# Patient Record
Sex: Female | Born: 1935 | Race: White | Hispanic: No | Marital: Married | Smoking: Never smoker
Health system: Southern US, Community
[De-identification: ages and names within clinical notes are randomized; demographics above are authoritative.]

## PROBLEM LIST (undated history)

## (undated) DIAGNOSIS — K759 Inflammatory liver disease, unspecified: Secondary | ICD-10-CM

## (undated) DIAGNOSIS — I4891 Unspecified atrial fibrillation: Secondary | ICD-10-CM

## (undated) DIAGNOSIS — I5022 Chronic systolic (congestive) heart failure: Secondary | ICD-10-CM

## (undated) DIAGNOSIS — I447 Left bundle-branch block, unspecified: Secondary | ICD-10-CM

## (undated) DIAGNOSIS — E039 Hypothyroidism, unspecified: Secondary | ICD-10-CM

## (undated) DIAGNOSIS — Z86711 Personal history of pulmonary embolism: Secondary | ICD-10-CM

## (undated) HISTORY — PX: EYE SURGERY: SHX253

## (undated) HISTORY — DX: Inflammatory liver disease, unspecified: K75.9

## (undated) HISTORY — DX: Left bundle-branch block, unspecified: I44.7

## (undated) HISTORY — DX: Unspecified atrial fibrillation: I48.91

## (undated) HISTORY — DX: Hypothyroidism, unspecified: E03.9

## (undated) HISTORY — PX: JOINT REPLACEMENT: SHX530

## (undated) HISTORY — DX: Chronic systolic (congestive) heart failure: I50.22

## (undated) HISTORY — DX: Personal history of pulmonary embolism: Z86.711

## (undated) HISTORY — PX: CHOLECYSTECTOMY: SHX55

---

## 2002-05-16 ENCOUNTER — Ambulatory Visit (HOSPITAL_COMMUNITY): Admission: RE | Admit: 2002-05-16 | Discharge: 2002-05-16 | Payer: Self-pay | Admitting: Ophthalmology

## 2008-11-20 ENCOUNTER — Ambulatory Visit (HOSPITAL_COMMUNITY): Admission: RE | Admit: 2008-11-20 | Discharge: 2008-11-20 | Payer: Self-pay | Admitting: Ophthalmology

## 2015-07-19 ENCOUNTER — Encounter: Payer: Self-pay | Admitting: *Deleted

## 2015-09-14 ENCOUNTER — Ambulatory Visit: Payer: Self-pay | Admitting: Cardiology

## 2015-09-14 ENCOUNTER — Encounter: Payer: Self-pay | Admitting: Cardiology

## 2015-09-14 ENCOUNTER — Encounter: Payer: Self-pay | Admitting: *Deleted

## 2015-09-14 ENCOUNTER — Ambulatory Visit (INDEPENDENT_AMBULATORY_CARE_PROVIDER_SITE_OTHER): Payer: Medicare HMO | Admitting: Cardiology

## 2015-09-14 VITALS — BP 116/71 | HR 69 | Ht 66.0 in | Wt 182.6 lb

## 2015-09-14 DIAGNOSIS — I5022 Chronic systolic (congestive) heart failure: Secondary | ICD-10-CM

## 2015-09-14 DIAGNOSIS — E890 Postprocedural hypothyroidism: Secondary | ICD-10-CM

## 2015-09-14 DIAGNOSIS — I482 Chronic atrial fibrillation, unspecified: Secondary | ICD-10-CM

## 2015-09-14 DIAGNOSIS — Z136 Encounter for screening for cardiovascular disorders: Secondary | ICD-10-CM | POA: Diagnosis not present

## 2015-09-14 DIAGNOSIS — Z86711 Personal history of pulmonary embolism: Secondary | ICD-10-CM

## 2015-09-14 NOTE — Progress Notes (Signed)
Cardiology Office Note  Date: 09/14/2015   ID: Aimee Miles, DOB 10-13-1930, MRN [id number]  PCP: Aimee Conrad, MD  Consulting Cardiologist: Aimee Dell, MD   Chief Complaint  Patient presents with  . Atrial Fibrillation  . Cardiomyopathy    History of Present Illness: Aimee Miles is an 82 y.o. female referred to establish cardiology follow-up by Dr. Loney Hering. She previously followed by Dr. Molly Miles in the [facility] cardiology practice, last seen earlier this year. I do not have complete information but did update the chart with her history as outlined below.  She is here today with her daughter. She reports NYHA class 2-3 dyspnea, worse in the high humidity and heat. She does not describe any sense of palpitations or exertional chest pain. I reviewed her ECG today which shows atrial fibrillation at 74 bpm with left bundle branch block.  No old ECG available for comparison. I could not locate her last echocardiogram report or find a documented LVEF in the available records. Patient was under the impression that she was going to have a follow-up echocardiogram with Dr. Molly Miles in the near future.  I reviewed her medications which are outlined below. Cardiac regimen includes Eliquis, Coreg, Lasix, Imdur, Cozaar, and potassium supplements. She will be having a physical with lab work per Dr. Loney Hering next month. She does not report any bleeding episodes.  Past Medical History  Diagnosis Date  . Hepatitis   . Atrial fibrillation (HCC)   . LBBB (left bundle branch block)   . Chronic systolic (congestive) heart failure (HCC)   . Hypothyroidism   . History of pulmonary embolus (PE)     Bilateral PE January 2015 on Coumadin until June 2015    Past Surgical History  Procedure Laterality Date  . Cholecystectomy    . Eye surgery    . Joint replacement      Current Outpatient Prescriptions  Medication Sig Dispense Refill  . acetaminophen (TYLENOL) 325 MG tablet Take 325 mg by  mouth as needed.    Marland Kitchen apixaban (ELIQUIS) 2.5 MG TABS tablet Take 2.5 mg by mouth 2 (two) times daily.    . carvedilol (COREG) 12.5 MG tablet Take 12.5 mg by mouth 2 (two) times daily with a meal.    . clobetasol ointment (TEMOVATE) 0.05 % Apply 1 application topically 2 (two) times daily as needed.    . ferrous gluconate (FERGON) 324 MG tablet Take 324 mg by mouth daily with breakfast.    . folic acid (FOLVITE) 1 MG tablet Take 1 mg by mouth daily. Reported on 09/14/2015    . furosemide (LASIX) 40 MG tablet Take 40 mg by mouth daily.    Marland Kitchen HYDROcodone-acetaminophen (NORCO/VICODIN) 5-325 MG tablet Take 1 tablet by mouth every 6 (six) hours as needed for moderate pain.    . isosorbide mononitrate (IMDUR) 30 MG 24 hr tablet Take 30 mg by mouth daily.    Marland Kitchen levothyroxine (SYNTHROID, LEVOTHROID) 112 MCG tablet Take 112 mcg by mouth daily before breakfast.    . losartan (COZAAR) 25 MG tablet Take 25 mg by mouth daily.    . Multiple Vitamins-Minerals (MULTIVITAMIN ADULTS PO) Take 1 tablet by mouth 2 (two) times daily. Solutions Rx Superior Women's Multivitamin    . omeprazole (PRILOSEC) 20 MG capsule Take 20 mg by mouth daily.    . polyethylene glycol (MIRALAX / GLYCOLAX) packet Take 17 g by mouth daily as needed.    . potassium chloride (K-DUR) 10 MEQ tablet Take  10 mEq by mouth daily.    Marland Kitchen venlafaxine (EFFEXOR) 37.5 MG tablet Take 37.5 mg by mouth 2 (two) times daily.     No current facility-administered medications for this visit.   Allergies:  Codeine; Penicillins; and Sulfa antibiotics   Social History: The patient  reports that she has never smoked. She has never used smokeless tobacco. She reports that she does not drink alcohol or use illicit drugs.   Family History: The patient's family history includes Cervical cancer in her sister; Diabetes in her brother and father; Heart failure in her mother; Ovarian cancer in her daughter; Pancreatic cancer in her brother.   ROS:  Please see the history  of present illness. Otherwise, complete review of systems is positive for arthritic leg deformities, uses a cane.  All other systems are reviewed and negative.   Physical Exam: VS:  BP 116/71 mmHg  Pulse 69  Ht  (1.676 m)  Wt 182 lb 9.6 oz (82.827 kg)  BMI 29.49 kg/m2  SpO2 97%, BMI Body mass index is 29.49 kg/(m^2).  Wt Readings from Last 3 Encounters:  09/14/15 182 lb 9.6 oz (82.827 kg)    General: Elderly woman, appears comfortable at rest. HEENT: Conjunctiva and lids normal, oropharynx clear. Neck: Supple, no elevated JVP or carotid bruits, no thyromegaly. Lungs: Clear to auscultation, nonlabored breathing at rest. Cardiac: Irregularly irregular, no S3, soft systolic murmur, no pericardial rub. Abdomen: Soft, nontender, bowel sounds present, no guarding or rebound. Extremities: Venous stasis, distal pulses 2+. Skin: Warm and dry. Musculoskeletal: No kyphosis. Arthritic ankle deformities. Neuropsychiatric: Alert and oriented x3, affect grossly appropriate.  ECG: No old tracing available for comparison.  Assessment and Plan:  1. Chronic atrial fibrillation by history. CHADSVASC score is 4. She is on Eliquis for stroke prophylaxis, does not report any bleeding problems. She is due for follow-up lab work with Dr. Loney Hering. I reviewed her ECG today. Heart rate control looks to be adequate on present regimen.  2. History of chronic systolic heart failure based on records, LVEF not certain at this time. She reports NYHA class 2-3 dyspnea. Plan is to obtain her old echocardiogram report from [place], and follow-up with a repeat study to get a more recent assessment.  3. Hypothyroidism, on Synthroid, followed by Dr. Loney Hering.  4. Previous history of bilateral pulmonary emboli in 2015.  Current medicines were reviewed with the patient today.   Orders Placed This Encounter  Procedures  . EKG 12-Lead  . ECHOCARDIOGRAM COMPLETE    Disposition: Follow-up with me in 6  weeks.  Signed, Jonelle Sidle, MD, [facility] 09/14/2015 9:40 AM    [facility] at [facility] [address] [place], [place], [place] [address] Phone: [phone]; Fax: [phone]

## 2015-09-14 NOTE — Patient Instructions (Addendum)
Medication Instructions:   Your physician recommends that you continue on your current medications as directed. Please refer to the Current Medication list given to you today.   Labwork:  NONE  Testing/Procedures:  Your physician has requested that you have an echocardiogram. Echocardiography is a painless test that uses sound waves to create images of your heart. It provides your doctor with information about the size and shape of your heart and how well your heart's chambers and valves are working. This procedure takes approximately one hour. There are no restrictions for this procedure.    Follow-Up:  Your physician recommends that you schedule a follow-up appointment in: 6 weeks   Any Other Special Instructions Will Be Listed Below (If Applicable).     If you need a refill on your cardiac medications before your next appointment, please call your pharmacy.   

## 2015-09-27 ENCOUNTER — Other Ambulatory Visit: Payer: Self-pay

## 2015-09-27 ENCOUNTER — Ambulatory Visit (INDEPENDENT_AMBULATORY_CARE_PROVIDER_SITE_OTHER): Payer: Medicare HMO

## 2015-09-27 DIAGNOSIS — Z136 Encounter for screening for cardiovascular disorders: Secondary | ICD-10-CM

## 2015-09-27 LAB — ECHOCARDIOGRAM COMPLETE
E decel time: 251 ms
FS: 11 % — AB (ref 28–44)
IVS/LV PW RATIO, ED: 0.72
LA ID, A-P, ES: 39 mm
LA diam end sys: 39 mm
LA diam index: 2.03 cm/m2
LA vol A4C: 65.4 mL
LA vol index: 38.8 mL/m2
LA vol: 74.4 mL
LV PW d: 10.8 mm — AB (ref 0.6–1.1)
LV dias vol index: 32 mL/m2
LV dias vol: 62 mL (ref 46–106)
LV sys vol index: 25 mL/m2
LV sys vol: 48 mL — AB (ref 14–42)
LVOT SV: 40 mL
LVOT VTI: 14.1 cm
LVOT area: 2.84 cm2
LVOT diameter: 19 mm
LVOT peak vel: 87.2 cm/s
MV Dec: 251
MV Peak grad: 5 mmHg
MV pk E vel: 112 m/s
Simpson's disk: 23
Stroke v: 14 mL
TAPSE: 19.6 mm

## 2015-09-28 ENCOUNTER — Telehealth: Payer: Self-pay | Admitting: *Deleted

## 2015-09-28 NOTE — Telephone Encounter (Signed)
-----   Message from Jonelle Sidle, MD sent at 09/27/2015  3:19 PM EDT ----- Results reviewed. Patient recently established, former patient of Dr. Molly Maduro. She has a history of cardiomyopathy and her LVEF is stable compared to the prior study done at [facility]. We will continue medical therapy and discuss further follow-up visit. A copy of this test should be forwarded to Ernestine Conrad, MD.

## 2015-09-28 NOTE — Telephone Encounter (Signed)
Patient informed and copy sent to PCP. 

## 2015-10-31 NOTE — Progress Notes (Signed)
Cardiology Office Note  Date: 11/01/2015   ID: DEBORAN MCKINNEY, DOB 11/24/1930, MRN [id number]  PCP: Ernestine Conrad, MD  Primary Cardiologist: Nona Dell, MD   Chief Complaint  Patient presents with  . Atrial Fibrillation  . Cardiomyopathy    History of Present Illness: Aimee Miles is an 82 y.o. female that recently established follow-up in July. She presents for a follow-up visit. Overall no major change in status. She does not report palpitations, has chronic dyspnea on exertion as before.  Follow-up echocardiogram done recently revealed LVEF 35-40% which is in line with her previous echocardiogram from [facility] in 2015. We discussed the results today. She reports compliance with her medications which are outlined below. Blood pressure and heart rate are well controlled today. Also states that her weight does not fluctuate more than about 2 pounds, and has been stable. She knows to take an extra Lasix if her weight goes up 2-3 pounds in 24 hours.  Past Medical History:  Diagnosis Date  . Atrial fibrillation (HCC)   . Chronic systolic (congestive) heart failure (HCC)   . Hepatitis   . History of pulmonary embolus (PE)    Bilateral PE January 2015 on Coumadin until June 2015  . Hypothyroidism   . LBBB (left bundle branch block)     Current Outpatient Prescriptions  Medication Sig Dispense Refill  . acetaminophen (TYLENOL) 325 MG tablet Take 325 mg by mouth as needed.    Marland Kitchen apixaban (ELIQUIS) 2.5 MG TABS tablet Take 2.5 mg by mouth 2 (two) times daily.    . carvedilol (COREG) 12.5 MG tablet Take 12.5 mg by mouth 2 (two) times daily with a meal.    . clobetasol ointment (TEMOVATE) 0.05 % Apply 1 application topically 2 (two) times daily as needed.    . ferrous gluconate (FERGON) 324 MG tablet Take 324 mg by mouth daily with breakfast.    . folic acid (FOLVITE) 1 MG tablet Take 1 mg by mouth daily. Reported on 09/14/2015    . furosemide (LASIX) 40 MG tablet Take 40 mg by  mouth daily.    Marland Kitchen HYDROcodone-acetaminophen (NORCO/VICODIN) 5-325 MG tablet Take 1 tablet by mouth every 6 (six) hours as needed for moderate pain.    . isosorbide mononitrate (IMDUR) 30 MG 24 hr tablet Take 30 mg by mouth daily.    Marland Kitchen levothyroxine (SYNTHROID, LEVOTHROID) 112 MCG tablet Take 112 mcg by mouth daily before breakfast.    . losartan (COZAAR) 25 MG tablet Take 25 mg by mouth daily.    . Multiple Vitamins-Minerals (MULTIVITAMIN ADULTS PO) Take 1 tablet by mouth 2 (two) times daily. Solutions Rx Superior Women's Multivitamin    . omeprazole (PRILOSEC) 20 MG capsule Take 20 mg by mouth daily.    . polyethylene glycol (MIRALAX / GLYCOLAX) packet Take 17 g by mouth daily as needed.    . potassium chloride (K-DUR) 10 MEQ tablet Take 10 mEq by mouth daily.    Marland Kitchen venlafaxine (EFFEXOR) 37.5 MG tablet Take 37.5 mg by mouth 2 (two) times daily.     No current facility-administered medications for this visit.    Allergies:  Codeine; Penicillins; and Sulfa antibiotics   Social History: The patient  reports that she has never smoked. She has never used smokeless tobacco. She reports that she does not drink alcohol or use drugs.   ROS:  Please see the history of present illness. Otherwise, complete review of systems is positive for chronic leg pain and arthritic  symptoms.  All other systems are reviewed and negative.   Physical Exam: VS:  BP 120/78   Pulse 62   Ht 5\' 6"  (1.676 m)   Wt 180 lb (81.6 kg)   SpO2 98%   BMI 29.05 kg/m , BMI Body mass index is 29.05 kg/m.  Wt Readings from Last 3 Encounters:  11/01/15 180 lb (81.6 kg)  09/14/15 182 lb 9.6 oz (82.8 kg)    General: Overweight elderly woman, appears comfortable at rest. HEENT: Conjunctiva and lids normal, oropharynx clear. Neck: Supple, no elevated JVP or carotid bruits, no thyromegaly. Lungs: Clear to auscultation, nonlabored breathing at rest. Cardiac: Irregularly irregular, no S3, soft systolic murmur, no pericardial  rub. Abdomen: Soft, nontender, bowel sounds present, no guarding or rebound. Extremities: Venous stasis and chronic appearing leg edema, distal pulses 2+.  ECG: I personally reviewed the tracing from 09/14/2015 which showed rate-controlled atrial fibrillation with left bundle branch block.  Other Studies Reviewed Today:  Echocardiogram 09/27/2015: Study Conclusions  - Left ventricle: The cavity size was normal. Wall thickness was   normal. Systolic function was moderately reduced. The estimated   ejection fraction was in the range of 35% to 40%. Diffuse   hypokinesis. The study is not technically sufficient to allow   evaluation of LV diastolic function. - Ventricular septum: Septal motion showed paradox. - Aortic valve: Calcified annulus. Trileaflet. - Mitral valve: There was trivial regurgitation. - Left atrium: The atrium was moderately dilated. - Right atrium: Central venous pressure (est): 3 mm Hg. - Atrial septum: The septum bowed from left to right, consistent   with increased left atrial pressure. No defect or patent foramen   ovale was identified. - Tricuspid valve: There was moderate regurgitation. - Pulmonary arteries: PA peak pressure: 35 mm Hg (S). - Pericardium, extracardiac: There was no pericardial effusion.  Impressions:  - Normal LV wall thickness with LVEF 35-40%. There is diffuse   hypokinesis with paradoxical septal motion consistent with left   bundle branch block. Indeterminate diastolic function in the   setting of atrial fibrillation. Moderate left atrial enlargement   with evidence of increased left atrial pressure. Trivial mitral   regurgitation. Mildly sclerotic aortic valve. Moderate tricuspid   regurgitation with PASP 35 mmHg.  Assessment and Plan:  1. Cardiomyopathy, suspected nonischemic at this point. LVEF has been stable based on recent study in comparison with prior evaluation in [place]. LVEF 35-40%. Plan is to continue medical therapy. Do  not plan to pursue more aggressive evaluation such as CRT-D or cardiac catheterization at this time.  2. Chronic atrial fibrillation. Heart rate well controlled on Coreg. She continues on Eliquis for stroke prophylaxis.  Current medicines were reviewed with the patient today.  Disposition: Follow-up with me in 6 months.  Signed, Jonelle SidleSamuel G. Lizzet Hendley, MD, [facility] 11/01/2015 11:54 AM    [facility] [address] [place], [place], [place] [address] Phone: [phone]; Fax: [phone]

## 2015-11-01 ENCOUNTER — Encounter: Payer: Self-pay | Admitting: Cardiology

## 2015-11-01 ENCOUNTER — Ambulatory Visit (INDEPENDENT_AMBULATORY_CARE_PROVIDER_SITE_OTHER): Payer: Medicare HMO | Admitting: Cardiology

## 2015-11-01 VITALS — BP 120/78 | HR 62 | Ht 66.0 in | Wt 180.0 lb

## 2015-11-01 DIAGNOSIS — I5022 Chronic systolic (congestive) heart failure: Secondary | ICD-10-CM | POA: Diagnosis not present

## 2015-11-01 DIAGNOSIS — I482 Chronic atrial fibrillation, unspecified: Secondary | ICD-10-CM

## 2015-11-01 NOTE — Patient Instructions (Signed)

## 2016-04-24 NOTE — Progress Notes (Signed)
Cardiology Office Note  Date: 04/25/2016   ID: Aimee Miles, DOB 17-Apr-1930, MRN [id number]  PCP: Ernestine Conrad, MD  Primary Cardiologist: Nona Dell, MD   Chief Complaint  Patient presents with  . Atrial Fibrillation    History of Present Illness: Aimee Miles is an 83 y.o. female last seen in August 2017. She is here today for a follow-up visit. From a cardiac perspective, reports no chest pain or worsening shortness of breath. She is functionally limited, uses a walker or wheelchair.  Blood pressure and heart rate are well controlled today. She does not report any palpitations. Current cardiac regimen includes Eliquis, Coreg, Lasix, potassium supplements, Imdur, and Cozaar. She does not report any spontaneous bleeding problems. Lab work is followed by Dr. Loney Hering.  Echocardiogram from July 2017 is outlined below. We are managing her cardiomyopathy conservatively.  Past Medical History:  Diagnosis Date  . Atrial fibrillation (HCC)   . Chronic systolic (congestive) heart failure   . Hepatitis   . History of pulmonary embolus (PE)    Bilateral PE January 2015 on Coumadin until June 2015  . Hypothyroidism   . LBBB (left bundle branch block)     Past Surgical History:  Procedure Laterality Date  . CHOLECYSTECTOMY    . EYE SURGERY    . JOINT REPLACEMENT      Current Outpatient Prescriptions  Medication Sig Dispense Refill  . acetaminophen (TYLENOL) 325 MG tablet Take 325 mg by mouth as needed.    Marland Kitchen apixaban (ELIQUIS) 2.5 MG TABS tablet Take 2.5 mg by mouth 2 (two) times daily.    . carvedilol (COREG) 12.5 MG tablet Take 12.5 mg by mouth 2 (two) times daily with a meal.    . clobetasol ointment (TEMOVATE) 0.05 % Apply 1 application topically 2 (two) times daily as needed.    . ferrous gluconate (FERGON) 324 MG tablet Take 324 mg by mouth daily with breakfast.    . folic acid (FOLVITE) 1 MG tablet Take 1 mg by mouth daily. Reported on 09/14/2015    . furosemide  (LASIX) 40 MG tablet Take 40 mg by mouth daily.    . isosorbide mononitrate (IMDUR) 30 MG 24 hr tablet Take 30 mg by mouth daily.    Marland Kitchen levothyroxine (SYNTHROID, LEVOTHROID) 112 MCG tablet Take 112 mcg by mouth daily before breakfast.    . losartan (COZAAR) 25 MG tablet Take 25 mg by mouth daily.    . Multiple Vitamins-Minerals (MULTIVITAMIN ADULTS PO) Take 1 tablet by mouth 2 (two) times daily. Solutions Rx Superior Women's Multivitamin    . omeprazole (PRILOSEC) 20 MG capsule Take 20 mg by mouth daily.    . polyethylene glycol (MIRALAX / GLYCOLAX) packet Take 17 g by mouth daily as needed.    . potassium chloride (K-DUR) 10 MEQ tablet Take 10 mEq by mouth daily.    Marland Kitchen tiZANidine (ZANAFLEX) 4 MG capsule Take 4 mg by mouth 3 (three) times daily as needed for muscle spasms.    Marland Kitchen venlafaxine (EFFEXOR) 75 MG tablet Take 75 mg by mouth 2 (two) times daily with a meal.     No current facility-administered medications for this visit.    Allergies:  Codeine; Penicillins; and Sulfa antibiotics   Social History: The patient  reports that she has never smoked. She has never used smokeless tobacco. She reports that she does not drink alcohol or use drugs.   ROS:  Please see the history of present illness. Otherwise, complete review  of systems is positive for chronic leg and hip pain.  All other systems are reviewed and negative.   Physical Exam: VS:  BP 112/67   Pulse 65   Ht 5\' 6"  (1.676 m)   Wt 177 lb (80.3 kg)   SpO2 98%   BMI 28.57 kg/m , BMI Body mass index is 28.57 kg/m.  Wt Readings from Last 3 Encounters:  04/25/16 177 lb (80.3 kg)  11/01/15 180 lb (81.6 kg)  09/14/15 182 lb 9.6 oz (82.8 kg)    General: Overweight elderly woman, appears comfortable at rest. Using a walker. HEENT: Conjunctiva and lids normal, oropharynx clear. Neck: Supple, no elevated JVP or carotid bruits, no thyromegaly. Lungs: Clear to auscultation, nonlabored breathing at rest. Cardiac: Irregularly irregular, no  S3, soft systolic murmur, no pericardial rub. Abdomen: Soft, nontender, bowel sounds present, no guarding or rebound. Extremities: Venous stasis and chronic appearing leg edema, distal pulses 2+. Skin: Warm and dry. Musculoskeletal: No kyphosis. Neuropsychiatric: Alert and oriented 3, affect appropriate.  ECG: I personally reviewed the tracing from 09/14/2015 which showed rate-controlled atrial fibrillation with left bundle branch block.  Other Studies Reviewed Today:  Echocardiogram 09/27/2015: Study Conclusions  - Left ventricle: The cavity size was normal. Wall thickness was normal. Systolic function was moderately reduced. The estimated ejection fraction was in the range of 35% to 40%. Diffuse hypokinesis. The study is not technically sufficient to allow evaluation of LV diastolic function. - Ventricular septum: Septal motion showed paradox. - Aortic valve: Calcified annulus. Trileaflet. - Mitral valve: There was trivial regurgitation. - Left atrium: The atrium was moderately dilated. - Right atrium: Central venous pressure (est): 3 mm Hg. - Atrial septum: The septum bowed from left to right, consistent with increased left atrial pressure. No defect or patent foramen ovale was identified. - Tricuspid valve: There was moderate regurgitation. - Pulmonary arteries: PA peak pressure: 35 mm Hg (S). - Pericardium, extracardiac: There was no pericardial effusion.  Impressions:  - Normal LV wall thickness with LVEF 35-40%. There is diffuse hypokinesis with paradoxical septal motion consistent with left bundle branch block. Indeterminate diastolic function in the setting of atrial fibrillation. Moderate left atrial enlargement with evidence of increased left atrial pressure. Trivial mitral regurgitation. Mildly sclerotic aortic valve. Moderate tricuspid regurgitation with PASP 35 mmHg.  Assessment and Plan:  1. Chronic atrial fibrillation. Continue  strategy of heart rate control and anticoagulation.  2. Cardiomyopathy, possibly nonischemic with diffuse hypokinesis. LVEF 35-40%. Weight is down and she has no evidence of progressive volume excess. Plan is to continue medical therapy, overall conservative management.  3. Left bundle branch block, old.  4. Hypothyroidism, on Synthroid. She follows with Dr. Loney Hering.  Current medicines were reviewed with the patient today.  Disposition: Follow-up in 6 months.  Signed, Jonelle Sidle, MD, [facility] 04/25/2016 11:07 AM    [facility] at [facility] [address] [place], [place], [place] [address] Phone: [phone]; Fax: [phone]

## 2016-04-25 ENCOUNTER — Ambulatory Visit (INDEPENDENT_AMBULATORY_CARE_PROVIDER_SITE_OTHER): Payer: Medicare HMO | Admitting: Cardiology

## 2016-04-25 ENCOUNTER — Encounter: Payer: Self-pay | Admitting: Cardiology

## 2016-04-25 VITALS — BP 112/67 | HR 65 | Ht 66.0 in | Wt 177.0 lb

## 2016-04-25 DIAGNOSIS — I428 Other cardiomyopathies: Secondary | ICD-10-CM

## 2016-04-25 DIAGNOSIS — I482 Chronic atrial fibrillation, unspecified: Secondary | ICD-10-CM

## 2016-04-25 DIAGNOSIS — I447 Left bundle-branch block, unspecified: Secondary | ICD-10-CM | POA: Diagnosis not present

## 2016-04-25 DIAGNOSIS — E039 Hypothyroidism, unspecified: Secondary | ICD-10-CM

## 2016-04-25 NOTE — Patient Instructions (Signed)

## 2016-10-14 ENCOUNTER — Encounter: Payer: Self-pay | Admitting: Cardiology

## 2016-10-14 ENCOUNTER — Ambulatory Visit (INDEPENDENT_AMBULATORY_CARE_PROVIDER_SITE_OTHER): Payer: Medicare HMO | Admitting: Cardiology

## 2016-10-14 VITALS — BP 112/62 | HR 84 | Ht 66.0 in | Wt 174.4 lb

## 2016-10-14 DIAGNOSIS — I428 Other cardiomyopathies: Secondary | ICD-10-CM

## 2016-10-14 DIAGNOSIS — I447 Left bundle-branch block, unspecified: Secondary | ICD-10-CM

## 2016-10-14 DIAGNOSIS — E039 Hypothyroidism, unspecified: Secondary | ICD-10-CM | POA: Diagnosis not present

## 2016-10-14 DIAGNOSIS — I482 Chronic atrial fibrillation, unspecified: Secondary | ICD-10-CM

## 2016-10-14 NOTE — Patient Instructions (Signed)

## 2016-10-14 NOTE — Progress Notes (Signed)
Cardiology Office Note  Date: 10/14/2016   ID: Aimee Miles, DOB 1930-04-13, MRN [id number]  PCP: Richardean Chimera, MD  Primary Cardiologist: Nona Dell, MD   Chief Complaint  Patient presents with  . Atrial Fibrillation    History of Present Illness: Aimee Miles is an 83 y.o. female last seen in February. She is here today with family member for a follow-up visit. From a cardiac perspective she reports no major changes, specifically no palpitations or chest pain. She has chronic dyspnea on exertion. She is establishing with Dr. Reuel Boom later this month for PCP.  We went over her medications. Current cardiac regimen includes Eliquis, Coreg, Lasix, Imdur, Cozaar, and potassium supplements. She does not report any bleeding problems. She reports stable weight, no orthopnea or PND. She uses a walker, denies any recent falls.  I personally reviewed her ECG today which shows rate-controlled atrial fibrillation with left bundle-branch block.  Past Medical History:  Diagnosis Date  . Atrial fibrillation (HCC)   . Chronic systolic (congestive) heart failure (HCC)   . Hepatitis   . History of pulmonary embolus (PE)    Bilateral PE January 2015 on Coumadin until June 2015  . Hypothyroidism   . LBBB (left bundle branch block)     Past Surgical History:  Procedure Laterality Date  . CHOLECYSTECTOMY    . EYE SURGERY    . JOINT REPLACEMENT      Current Outpatient Prescriptions  Medication Sig Dispense Refill  . acetaminophen (TYLENOL) 325 MG tablet Take 325 mg by mouth as needed.    Marland Kitchen apixaban (ELIQUIS) 2.5 MG TABS tablet Take 2.5 mg by mouth 2 (two) times daily.    . carvedilol (COREG) 12.5 MG tablet Take 12.5 mg by mouth 2 (two) times daily with a meal.    . clobetasol ointment (TEMOVATE) 0.05 % Apply 1 application topically 2 (two) times daily as needed.    . ferrous gluconate (FERGON) 324 MG tablet Take 324 mg by mouth daily with breakfast.    . folic acid (FOLVITE) 1 MG  tablet Take 1 mg by mouth daily. Reported on 09/14/2015    . furosemide (LASIX) 40 MG tablet Take 40 mg by mouth daily.    . isosorbide mononitrate (IMDUR) 30 MG 24 hr tablet Take 30 mg by mouth daily.    Marland Kitchen levothyroxine (SYNTHROID, LEVOTHROID) 112 MCG tablet Take 112 mcg by mouth daily before breakfast.    . losartan (COZAAR) 25 MG tablet Take 25 mg by mouth daily.    . methocarbamol (ROBAXIN) 500 MG tablet Take 500-1,000 mg by mouth 3 (three) times daily.    . Multiple Vitamins-Minerals (MULTIVITAMIN ADULTS PO) Take 1 tablet by mouth 2 (two) times daily. Solutions Rx Superior Women's Multivitamin    . omeprazole (PRILOSEC) 20 MG capsule Take 20 mg by mouth daily.    . polyethylene glycol (MIRALAX / GLYCOLAX) packet Take 17 g by mouth daily as needed.    . potassium chloride (K-DUR) 10 MEQ tablet Take 10 mEq by mouth daily.    Marland Kitchen venlafaxine (EFFEXOR) 75 MG tablet Take 75 mg by mouth 2 (two) times daily with a meal.     No current facility-administered medications for this visit.    Allergies:  Codeine; Penicillins; and Sulfa antibiotics   Social History: The patient  reports that she has never smoked. She has never used smokeless tobacco. She reports that she does not drink alcohol or use drugs.   ROS:  Please  see the history of present illness. Otherwise, complete review of systems is positive for intermittent cough.  All other systems are reviewed and negative.   Physical Exam: VS:  BP 112/62   Pulse 84   Ht 5\' 6"  (1.676 m)   Wt 174 lb 6.4 oz (79.1 kg)   SpO2 94%   BMI 28.15 kg/m , BMI Body mass index is 28.15 kg/m.  Wt Readings from Last 3 Encounters:  10/14/16 174 lb 6.4 oz (79.1 kg)  04/25/16 177 lb (80.3 kg)  11/01/15 180 lb (81.6 kg)    General: Overweight elderlywoman, appears comfortable at rest. Using a walker. HEENT: Conjunctiva and lids normal, oropharynx clear. Neck: Supple, no elevated JVP or carotid bruits, no thyromegaly. Lungs: Clear to auscultation, nonlabored  breathing at rest. Cardiac: Irregularly irregular, no S3, soft systolic murmur, no pericardial rub. Abdomen: Soft, nontender, bowel sounds present, no guarding or rebound. Extremities: Venous stasisand chronic appearing leg edema, distal pulses 2+. Skin: Warm and dry. Musculoskeletal: No kyphosis. Neuropsychiatric: Alert and oriented 3, affect appropriate.  ECG: I personally reviewed the tracing from 09/14/2015 which showed rate-controlled atrial fibrillation with left bundle branch block.  Other Studies Reviewed Today:  Echocardiogram7/27/2017: Study Conclusions  - Left ventricle: The cavity size was normal. Wall thickness was normal. Systolic function was moderately reduced. The estimated ejection fraction was in the range of 35% to 40%. Diffuse hypokinesis. The study is not technically sufficient to allow evaluation of LV diastolic function. - Ventricular septum: Septal motion showed paradox. - Aortic valve: Calcified annulus. Trileaflet. - Mitral valve: There was trivial regurgitation. - Left atrium: The atrium was moderately dilated. - Right atrium: Central venous pressure (est): 3 mm Hg. - Atrial septum: The septum bowed from left to right, consistent with increased left atrial pressure. No defect or patent foramen ovale was identified. - Tricuspid valve: There was moderate regurgitation. - Pulmonary arteries: PA peak pressure: 35 mm Hg (S). - Pericardium, extracardiac: There was no pericardial effusion.  Impressions:  - Normal LV wall thickness with LVEF 35-40%. There is diffuse hypokinesis with paradoxical septal motion consistent with left bundle branch block. Indeterminate diastolic function in the setting of atrial fibrillation. Moderate left atrial enlargement with evidence of increased left atrial pressure. Trivial mitral regurgitation. Mildly sclerotic aortic valve. Moderate tricuspid regurgitation with PASP 35 mmHg.  Assessment and  Plan:  1. Symptomatically stable chronic atrial fibrillation, heart rate adequately controlled on current regimen. She continues on Eliquis for stroke prophylaxis. She is due for follow-up lab work, CBC and BMET, can have this obtained when she establishes with Dr. Reuel Boom.  2. Suspected nonischemic cardiomyopathy with LVEF 35-40%. Plan is to continue medical therapy and overall conservative management. No changes were made today.  3. Chronic left bundle branch block.  4. Hypothyroidism on Synthroid. Will follow-up TSH with Dr. Reuel Boom.  Current medicines were reviewed with the patient today.   Orders Placed This Encounter  Procedures  . EKG 12-Lead    Disposition: Follow-up in 6 months.  Signed, Jonelle Sidle, MD, [facility] 10/14/2016 2:51 PM    [facility] at [facility] [address] [place], [place], [place] [address] Phone: [phone]; Fax: [phone]

## 2016-10-15 ENCOUNTER — Ambulatory Visit: Payer: Medicare HMO | Admitting: Cardiology

## 2017-04-13 NOTE — Progress Notes (Signed)
Cardiology Office Note  Date: 04/15/2017   ID: Aimee Miles, DOB 1931-02-05, MRN [id number]  PCP: Richardean Chimera, MD  Primary Cardiologist: Nona Dell, MD   Chief Complaint  Patient presents with  . Atrial Fibrillation    History of Present Illness: Aimee Miles is an 84 y.o. female last seen in August 2018.  She is here today with her daughter for a follow-up visit.  Describes chronic fatigue, no recent changes however.  No palpitations or chest pain.  She states that she has undergone recent colonoscopy due to heme positive stools, reportedly no active bleeding problems however and she remains on anticoagulation.  She has had follow-up lab work with Dr. Reuel Boom which we are requesting for review.  Current medications include Eliquis, Coreg, Imdur, Lasix, Cozaar, and potassium supplements.  Past Medical History:  Diagnosis Date  . Atrial fibrillation (HCC)   . Chronic systolic (congestive) heart failure (HCC)   . Hepatitis   . History of pulmonary embolus (PE)    Bilateral PE January 2015 on Coumadin until June 2015  . Hypothyroidism   . LBBB (left bundle branch block)     Past Surgical History:  Procedure Laterality Date  . CHOLECYSTECTOMY    . EYE SURGERY    . JOINT REPLACEMENT      Current Outpatient Medications  Medication Sig Dispense Refill  . acetaminophen (TYLENOL) 325 MG tablet Take 325 mg by mouth as needed.    Marland Kitchen apixaban (ELIQUIS) 2.5 MG TABS tablet Take 2.5 mg by mouth 2 (two) times daily.    . carvedilol (COREG) 12.5 MG tablet Take 12.5 mg by mouth 2 (two) times daily with a meal.    . clobetasol ointment (TEMOVATE) 0.05 % Apply 1 application topically 2 (two) times daily as needed.    . ferrous gluconate (FERGON) 324 MG tablet Take 324 mg by mouth daily with breakfast.    . folic acid (FOLVITE) 1 MG tablet Take 1 mg by mouth daily. Reported on 09/14/2015    . furosemide (LASIX) 40 MG tablet Take 40 mg by mouth daily.    . isosorbide mononitrate  (IMDUR) 30 MG 24 hr tablet Take 30 mg by mouth daily.    Marland Kitchen levothyroxine (SYNTHROID, LEVOTHROID) 112 MCG tablet Take 112 mcg by mouth daily before breakfast.    . losartan (COZAAR) 25 MG tablet Take 25 mg by mouth daily.    . methocarbamol (ROBAXIN) 500 MG tablet Take 500-1,000 mg by mouth 3 (three) times daily.    . Multiple Vitamins-Minerals (MULTIVITAMIN ADULTS PO) Take 1 tablet by mouth 2 (two) times daily. Solutions Rx Superior Women's Multivitamin    . omeprazole (PRILOSEC) 20 MG capsule Take 20 mg by mouth daily.    . polyethylene glycol (MIRALAX / GLYCOLAX) packet Take 17 g by mouth daily as needed.    . potassium chloride (K-DUR) 10 MEQ tablet Take 10 mEq by mouth daily.    Marland Kitchen venlafaxine (EFFEXOR) 75 MG tablet Take 75 mg by mouth 2 (two) times daily with a meal.     No current facility-administered medications for this visit.    Allergies:  Codeine; Penicillins; and Sulfa antibiotics   Social History: The patient  reports that  has never smoked. she has never used smokeless tobacco. She reports that she does not drink alcohol or use drugs.   ROS:  Please see the history of present illness. Otherwise, complete review of systems is positive for hearing loss, arthritic pain.  All  other systems are reviewed and negative.   Physical Exam: VS:  BP 98/62   Pulse 87   Ht 5\' 6"  (1.676 m)   Wt 172 lb (78 kg)   SpO2 97%   BMI 27.76 kg/m , BMI Body mass index is 27.76 kg/m.  Wt Readings from Last 3 Encounters:  04/15/17 172 lb (78 kg)  10/14/16 174 lb 6.4 oz (79.1 kg)  04/25/16 177 lb (80.3 kg)    General: Elderly woman, using walker. HEENT: Conjunctiva and lids normal, oropharynx clear. Neck: Supple, no elevated JVP or carotid bruits, no thyromegaly. Lungs: Clear to auscultation, nonlabored breathing at rest. Cardiac: Irregularly irregular, no S3, soft systolic murmur. Abdomen: Soft, nontender, bowel sounds present, no guarding or rebound. Extremities: Venous stasis and mild  lower leg edema, distal pulses 2+. Skin: Warm and dry. Musculoskeletal: No kyphosis. Neuropsychiatric: Alert and oriented x3, affect grossly appropriate.  ECG: I personally reviewed the tracing from 10/14/2016 which showed atrial fibrillation with left bundle branch block.  Recent Labwork: No results found for requested labs within last 8760 hours.  No results found for: CHOL, TRIG, HDL, CHOLHDL, VLDL, LDLCALC, LDLDIRECT  Other Studies Reviewed Today:  Echocardiogram7/27/2017: Study Conclusions  - Left ventricle: The cavity size was normal. Wall thickness was normal. Systolic function was moderately reduced. The estimated ejection fraction was in the range of 35% to 40%. Diffuse hypokinesis. The study is not technically sufficient to allow evaluation of LV diastolic function. - Ventricular septum: Septal motion showed paradox. - Aortic valve: Calcified annulus. Trileaflet. - Mitral valve: There was trivial regurgitation. - Left atrium: The atrium was moderately dilated. - Right atrium: Central venous pressure (est): 3 mm Hg. - Atrial septum: The septum bowed from left to right, consistent with increased left atrial pressure. No defect or patent foramen ovale was identified. - Tricuspid valve: There was moderate regurgitation. - Pulmonary arteries: PA peak pressure: 35 mm Hg (S). - Pericardium, extracardiac: There was no pericardial effusion.  Impressions:  - Normal LV wall thickness with LVEF 35-40%. There is diffuse hypokinesis with paradoxical septal motion consistent with left bundle branch block. Indeterminate diastolic function in the setting of atrial fibrillation. Moderate left atrial enlargement with evidence of increased left atrial pressure. Trivial mitral regurgitation. Mildly sclerotic aortic valve. Moderate tricuspid regurgitation with PASP 35 mmHg.  Assessment and Plan:  1.  Chronic atrial fibrillation.  Heart rate control is  adequate on Coreg.  She also continues on Eliquis.  We are requesting her most recent lab work from Dr. Reuel Boom.  2.  Probable nonischemic cardiomyopathy with LVEF 35-40% range.  Medical therapy is planned without additional invasive testing at this point.  3.  Left bundle branch block, chronic.  4.  Hypothyroidism on Synthroid.  She is following with Dr. Reuel Boom and has undergone adjustments in Synthroid dose.  Current medicines were reviewed with the patient today.  Disposition: Follow-up in 6 months.  Signed, Jonelle Sidle, MD, [facility] 04/15/2017 12:07 PM    [facility] at [facility] [address] [place], [place], [place] [address] Phone: [phone]; Fax: [phone]

## 2017-04-15 ENCOUNTER — Encounter: Payer: Self-pay | Admitting: Cardiology

## 2017-04-15 ENCOUNTER — Ambulatory Visit: Payer: Medicare HMO | Admitting: Cardiology

## 2017-04-15 VITALS — BP 98/62 | HR 87 | Ht 66.0 in | Wt 172.0 lb

## 2017-04-15 DIAGNOSIS — I447 Left bundle-branch block, unspecified: Secondary | ICD-10-CM

## 2017-04-15 DIAGNOSIS — E039 Hypothyroidism, unspecified: Secondary | ICD-10-CM | POA: Diagnosis not present

## 2017-04-15 DIAGNOSIS — I428 Other cardiomyopathies: Secondary | ICD-10-CM | POA: Diagnosis not present

## 2017-04-15 DIAGNOSIS — I482 Chronic atrial fibrillation, unspecified: Secondary | ICD-10-CM

## 2017-04-15 NOTE — Patient Instructions (Signed)

## 2017-10-15 NOTE — Progress Notes (Signed)
Cardiology Office Note  Date: 10/16/2017   ID: Aimee Miles, DOB [birth year], MRN [id number]  PCP: Aimee Chimera, MD  Primary Cardiologist: Aimee Dell, MD   Chief Complaint  Patient presents with  . Atrial Fibrillation    History of Present Illness:  Aimee Miles is an 84 y.o. female last seen in February.  She is here for a routine follow-up visit.  She does not report any chest pain or palpitations.  Has been under a lot of stress with her husband, he has dementia.  Reports chronic shortness of breath, relatively sedentary.  She enjoys Nutritional therapist.  We discussed follow-up lab work on [employer].  States she recently had blood work done at [facility], we are requesting the results.  She has a pending visit to see Dr. Reuel Miles.  I personally reviewed her ECG today which shows atrial fibrillation with left bundle branch block.  Current cardiac medications include Coreg, Eliquis, Lasix, Imdur, Cozaar, and potassium supplements.  Past Medical History:  Diagnosis Date  . Atrial fibrillation (HCC)   . Chronic systolic (congestive) heart failure (HCC)   . Hepatitis   . History of pulmonary embolus (PE)    Bilateral PE January 2015 on Coumadin until June 2015  . Hypothyroidism   . LBBB (left bundle branch block)     Past Surgical History:  Procedure Laterality Date  . CHOLECYSTECTOMY    . EYE SURGERY    . JOINT REPLACEMENT      Current Outpatient Medications  Medication Sig Dispense Refill  . acetaminophen (TYLENOL) 325 MG tablet Take 325 mg by mouth as needed.    Marland Kitchen apixaban (ELIQUIS) 2.5 MG TABS tablet Take 2.5 mg by mouth 2 (two) times daily.    . carvedilol (COREG) 12.5 MG tablet Take 12.5 mg by mouth 2 (two) times daily with a meal.    . clobetasol ointment (TEMOVATE) 0.05 % Apply 1 application topically 2 (two) times daily as needed.    . ferrous gluconate (FERGON) 324 MG tablet Take 324 mg by mouth daily with breakfast.    . folic acid (FOLVITE) 1 MG tablet  Take 1 mg by mouth daily. Reported on 09/14/2015    . furosemide (LASIX) 40 MG tablet Take 40 mg by mouth daily.    . isosorbide mononitrate (IMDUR) 30 MG 24 hr tablet Take 30 mg by mouth daily.    Marland Kitchen levothyroxine (SYNTHROID, LEVOTHROID) 112 MCG tablet Take 112 mcg by mouth daily before breakfast.    . losartan (COZAAR) 25 MG tablet Take 25 mg by mouth daily.    . methocarbamol (ROBAXIN) 500 MG tablet Take 500-1,000 mg by mouth 3 (three) times daily.    . Multiple Vitamins-Minerals (MULTIVITAMIN ADULTS PO) Take 1 tablet by mouth 2 (two) times daily. [employer] Rx Superior Women's Multivitamin    . omeprazole (PRILOSEC) 20 MG capsule Take 20 mg by mouth daily.    . potassium chloride (K-DUR) 10 MEQ tablet Take 10 mEq by mouth daily.    Marland Kitchen venlafaxine (EFFEXOR) 75 MG tablet Take 75 mg by mouth 2 (two) times daily with a meal.     No current facility-administered medications for this visit.    Allergies:  Codeine; Penicillins; and Sulfa antibiotics   Social History: The patient  reports that she has never smoked. She has never used smokeless tobacco. She reports that she does not drink alcohol or use drugs.   ROS:  Please see the history of present illness. Otherwise, complete review  of systems is positive for hearing loss.  All other systems are reviewed and negative.   Physical Exam: VS:  BP (!) 90/58   Pulse (!) 56   Ht 5\' 6"  (1.676 m)   Wt 165 lb (74.8 kg)   SpO2 98%   BMI 26.63 kg/m , BMI Body mass index is 26.63 kg/m.  Wt Readings from Last 3 Encounters:  10/16/17 165 lb (74.8 kg)  04/15/17 172 lb (78 kg)  10/14/16 174 lb 6.4 oz (79.1 kg)    General: Elderly woman, appears comfortable at rest.  Using walker. HEENT: Conjunctiva and lids normal, oropharynx clear. Neck: Supple, no elevated JVP or carotid bruits, no thyromegaly. Lungs: Clear to auscultation, nonlabored breathing at rest. Cardiac: Irregularly irregular, no S3, soft systolic murmur. Abdomen: Soft, nontender,bowel  sounds present. Extremities: Venous stasis and mild lower leg edema, distal pulses 2+. Skin: Warm and dry. Musculoskeletal: No kyphosis. Neuropsychiatric: Alert and oriented x3, affect grossly appropriate.  ECG: I personally reviewed the tracing from 10/14/2016 which showed atrial fibrillation with left bundle branch block.  Other Studies Reviewed Today:  Echocardiogram7/27/2017: Study Conclusions  - Left ventricle: The cavity size was normal. Wall thickness was normal. Systolic function was moderately reduced. The estimated ejection fraction was in the range of 35% to 40%. Diffuse hypokinesis. The study is not technically sufficient to allow evaluation of LV diastolic function. - Ventricular septum: Septal motion showed paradox. - Aortic valve: Calcified annulus. Trileaflet. - Mitral valve: There was trivial regurgitation. - Left atrium: The atrium was moderately dilated. - Right atrium: Central venous pressure (est): 3 mm Hg. - Atrial septum: The septum bowed from left to right, consistent with increased left atrial pressure. No defect or patent foramen ovale was identified. - Tricuspid valve: There was moderate regurgitation. - Pulmonary arteries: PA peak pressure: 35 mm Hg (S). - Pericardium, extracardiac: There was no pericardial effusion.  Impressions:  - Normal LV wall thickness with LVEF 35-40%. There is diffuse hypokinesis with paradoxical septal motion consistent with left bundle branch block. Indeterminate diastolic function in the setting of atrial fibrillation. Moderate left atrial enlargement with evidence of increased left atrial pressure. Trivial mitral regurgitation. Mildly sclerotic aortic valve. Moderate tricuspid regurgitation with PASP 35 mmHg.  Assessment and Plan:  1.  Permanent atrial fibrillation.  Continue strategy of heart rate control and anticoagulation.  We are requesting her recent lab work from [facility].  Rate is  adequately controlled today, I reviewed her ECG.  2.  Nonischemic cardiomyopathy with LVEF 35 to 40%.  We are managing her conservatively.  Current medicines were reviewed with the patient today.   Orders Placed This Encounter  Procedures  . EKG 12-Lead    Disposition: Follow-up in 6 months.  Signed, Jonelle Sidle, MD, [facility] 10/16/2017 11:53 AM    [facility] at [facility] [address] [place], [place], [place] [address] Phone: [phone]; Fax: [phone]

## 2017-10-16 ENCOUNTER — Encounter: Payer: Self-pay | Admitting: Cardiology

## 2017-10-16 ENCOUNTER — Ambulatory Visit: Payer: Medicare HMO | Admitting: Cardiology

## 2017-10-16 VITALS — BP 90/58 | HR 56 | Ht 66.0 in | Wt 165.0 lb

## 2017-10-16 DIAGNOSIS — I482 Chronic atrial fibrillation: Secondary | ICD-10-CM | POA: Diagnosis not present

## 2017-10-16 DIAGNOSIS — I4821 Permanent atrial fibrillation: Secondary | ICD-10-CM

## 2017-10-16 DIAGNOSIS — I428 Other cardiomyopathies: Secondary | ICD-10-CM

## 2017-10-16 NOTE — Patient Instructions (Signed)

## 2017-12-24 ENCOUNTER — Ambulatory Visit (INDEPENDENT_AMBULATORY_CARE_PROVIDER_SITE_OTHER): Payer: Medicare HMO | Admitting: Otolaryngology

## 2017-12-24 DIAGNOSIS — H9209 Otalgia, unspecified ear: Secondary | ICD-10-CM | POA: Diagnosis not present

## 2017-12-24 DIAGNOSIS — J310 Chronic rhinitis: Secondary | ICD-10-CM | POA: Diagnosis not present

## 2017-12-24 DIAGNOSIS — R51 Headache: Secondary | ICD-10-CM

## 2018-01-08 ENCOUNTER — Other Ambulatory Visit (INDEPENDENT_AMBULATORY_CARE_PROVIDER_SITE_OTHER): Payer: Self-pay | Admitting: Otolaryngology

## 2018-01-08 DIAGNOSIS — R519 Headache, unspecified: Secondary | ICD-10-CM

## 2018-01-08 DIAGNOSIS — J329 Chronic sinusitis, unspecified: Secondary | ICD-10-CM

## 2018-01-08 DIAGNOSIS — R51 Headache: Secondary | ICD-10-CM

## 2018-01-27 ENCOUNTER — Ambulatory Visit (HOSPITAL_COMMUNITY)
Admission: RE | Admit: 2018-01-27 | Discharge: 2018-01-27 | Disposition: A | Discharge status: Home / Self Care | Payer: Medicare HMO | Source: Ambulatory Visit | Attending: Otolaryngology | Admitting: Otolaryngology

## 2018-01-27 DIAGNOSIS — R51 Headache: Secondary | ICD-10-CM | POA: Diagnosis present

## 2018-01-27 DIAGNOSIS — R519 Headache, unspecified: Secondary | ICD-10-CM

## 2018-01-27 DIAGNOSIS — J329 Chronic sinusitis, unspecified: Secondary | ICD-10-CM

## 2018-04-21 NOTE — Progress Notes (Signed)
Cardiology Office Note  Date: 04/22/2018   ID: Aimee, Miles 1930/10/09, MRN [id number]  PCP: Richardean Chimera, MD  Primary Cardiologist: Nona Dell, MD   Chief Complaint  Patient presents with  . Atrial Fibrillation    History of Present Illness: Aimee Miles is an 85 y.o. female last seen in August 2019.  She is here today with her daughter.  She does not report any angina symptoms, remains chronically fatigued, daughter states that she feels like her mother sleeps a lot during the daytime.  Complicating matters however is the patient's husband with dementia, requiring a lot of attention at home, also up at nighttime affecting restful sleep.  She has dependent leg edema, better when she puts her feet up.  She has followed with Dr. Reuel Boom for lab work on [employer].  Does not report any bleeding problems.  I reviewed her medications and we discussed reducing Cozaar to 12.5 mg daily in light of low blood pressure.  Past Medical History:  Diagnosis Date  . Atrial fibrillation (HCC)   . Chronic systolic (congestive) heart failure (HCC)   . Hepatitis   . History of pulmonary embolus (PE)    Bilateral PE January 2015 on Coumadin until June 2015  . Hypothyroidism   . LBBB (left bundle branch block)     Past Surgical History:  Procedure Laterality Date  . CHOLECYSTECTOMY    . EYE SURGERY    . JOINT REPLACEMENT      Current Outpatient Medications  Medication Sig Dispense Refill  . acetaminophen (TYLENOL) 325 MG tablet Take 325 mg by mouth as needed.    Marland Kitchen apixaban (ELIQUIS) 2.5 MG TABS tablet Take 2.5 mg by mouth 2 (two) times daily.    . carvedilol (COREG) 12.5 MG tablet Take 12.5 mg by mouth 2 (two) times daily with a meal.    . clobetasol ointment (TEMOVATE) 0.05 % Apply 1 application topically 2 (two) times daily as needed.    . ferrous gluconate (FERGON) 324 MG tablet Take 324 mg by mouth daily with breakfast.    . folic acid (FOLVITE) 1 MG tablet Take 1 mg by  mouth daily. Reported on 09/14/2015    . furosemide (LASIX) 40 MG tablet Take 40 mg by mouth daily.    . isosorbide mononitrate (IMDUR) 30 MG 24 hr tablet Take 30 mg by mouth daily.    Marland Kitchen levothyroxine (SYNTHROID, LEVOTHROID) 100 MCG tablet Take 100 mcg by mouth daily.     Marland Kitchen losartan (COZAAR) 25 MG tablet Take 0.5 tablets (12.5 mg total) by mouth daily. 45 tablet 2  . methocarbamol (ROBAXIN) 500 MG tablet Take 500-1,000 mg by mouth 3 (three) times daily.    . Multiple Vitamins-Minerals (MULTIVITAMIN ADULTS PO) Take 1 tablet by mouth 2 (two) times daily. Solutions Rx Superior Women's Multivitamin    . omeprazole (PRILOSEC) 20 MG capsule Take 20 mg by mouth daily.    . potassium chloride (K-DUR) 10 MEQ tablet Take 10 mEq by mouth daily.    Marland Kitchen venlafaxine (EFFEXOR) 75 MG tablet Take 75 mg by mouth 2 (two) times daily with a meal.     No current facility-administered medications for this visit.    Allergies:  Codeine; Penicillins; and Sulfa antibiotics   Social History: The patient  reports that she has never smoked. She has never used smokeless tobacco. She reports that she does not drink alcohol or use drugs.  ROS:  Please see the history of present illness.  Otherwise, complete review of systems is positive for hearing loss.  All other systems are reviewed and negative.   Physical Exam: VS:  BP (!) 86/47   Pulse (!) 57   Ht 5\' 3"  (1.6 m)   Wt 157 lb (71.2 kg)   SpO2 98%   BMI 27.81 kg/m , BMI Body mass index is 27.81 kg/m.  Wt Readings from Last 3 Encounters:  04/22/18 157 lb (71.2 kg)  10/16/17 165 lb (74.8 kg)  04/15/17 172 lb (78 kg)    General: Elderly woman in wheelchair, appears comfortable. HEENT: Conjunctiva and lids normal, oropharynx clear. Neck: Supple, no elevated JVP or carotid bruits, no thyromegaly. Lungs: Clear to auscultation, nonlabored breathing at rest. Cardiac: Irregularly irregular, no S3, soft systolic murmur. Abdomen: Soft, nontender, bowel sounds  present. Extremities: He has stasis and chronic appearing lower leg edema, distal pulses 1-2+. Skin: Warm and dry. Musculoskeletal: No kyphosis. Neuropsychiatric: Alert and oriented x3, affect grossly appropriate.  ECG: I personally reviewed the tracing from 10/16/2017 which showed atrial fibrillation with left bundle branch block.  Recent Labwork:  August 2019: BUN 23, creatinine 1.37, potassium 3.7, AST 21, ALT 18, cholesterol 128, triglycerides 131, HDL 46, LDL 56, hemoglobin A1c 7.1%  Other Studies Reviewed Today:  Echocardiogram7/27/2017: Study Conclusions  - Left ventricle: The cavity size was normal. Wall thickness was normal. Systolic function was moderately reduced. The estimated ejection fraction was in the range of 35% to 40%. Diffuse hypokinesis. The study is not technically sufficient to allow evaluation of LV diastolic function. - Ventricular septum: Septal motion showed paradox. - Aortic valve: Calcified annulus. Trileaflet. - Mitral valve: There was trivial regurgitation. - Left atrium: The atrium was moderately dilated. - Right atrium: Central venous pressure (est): 3 mm Hg. - Atrial septum: The septum bowed from left to right, consistent with increased left atrial pressure. No defect or patent foramen ovale was identified. - Tricuspid valve: There was moderate regurgitation. - Pulmonary arteries: PA peak pressure: 35 mm Hg (S). - Pericardium, extracardiac: There was no pericardial effusion.  Impressions:  - Normal LV wall thickness with LVEF 35-40%. There is diffuse hypokinesis with paradoxical septal motion consistent with left bundle branch block. Indeterminate diastolic function in the setting of atrial fibrillation. Moderate left atrial enlargement with evidence of increased left atrial pressure. Trivial mitral regurgitation. Mildly sclerotic aortic valve. Moderate tricuspid regurgitation with PASP 35 mmHg.  Assessment and  Plan:  1.  Permanent atrial fibrillation, heart rate control is good today on Coreg.  She remains on renally dosed Eliquis without bleeding problems.  2.  Nonischemic cardiomyopathy with LVEF 35 to 40%.  This is being managed conservatively.  She remains on Coreg, Cozaar dose will be reduced to due to lower blood pressure.  No change in present diuretic regimen although her weight is down.  She continues to follow with Dr. Reuel Boom as well.  3.  Chronic left bundle branch block.  4.  Hypothyroidism, on Synthroid.  She follows with Dr. Reuel Boom.  Current medicines were reviewed with the patient today.  Disposition: Follow-up in 6 months.  Signed, Jonelle Sidle, MD, [facility] 04/22/2018 11:24 AM    [facility] [address] [place], [place], [place] [address] Phone: [phone]; Fax: [phone]

## 2018-04-22 ENCOUNTER — Ambulatory Visit: Payer: Medicare HMO | Admitting: Cardiology

## 2018-04-22 ENCOUNTER — Encounter: Payer: Self-pay | Admitting: Cardiology

## 2018-04-22 VITALS — BP 86/47 | HR 57 | Ht 63.0 in | Wt 157.0 lb

## 2018-04-22 DIAGNOSIS — I4821 Permanent atrial fibrillation: Secondary | ICD-10-CM | POA: Diagnosis not present

## 2018-04-22 DIAGNOSIS — I447 Left bundle-branch block, unspecified: Secondary | ICD-10-CM

## 2018-04-22 DIAGNOSIS — E039 Hypothyroidism, unspecified: Secondary | ICD-10-CM

## 2018-04-22 DIAGNOSIS — I428 Other cardiomyopathies: Secondary | ICD-10-CM

## 2018-04-22 MED ORDER — LOSARTAN POTASSIUM 25 MG PO TABS: 12.5000 mg | ORAL_TABLET | Freq: Every day | ORAL | 2 refills | Status: DC

## 2018-04-22 NOTE — Patient Instructions (Addendum)
Medication Instructions:   Your physician has recommended you make the following change in your medication:   Decrease losartan to 12.5 mg by mouth daily. Please break your 25 mg tablet in half.  Continue all other medications the same  Labwork:  NONE  Testing/Procedures:  NONE  Follow-Up:  Your physician recommends that you schedule a follow-up appointment in: 6 months. You will receive a reminder letter in the mail in about 4 months reminding you to call and schedule your appointment. If you don't receive this letter, please contact our office.   Any Other Special Instructions Will Be Listed Below (If Applicable).  If you need a refill on your cardiac medications before your next appointment, please call your pharmacy.

## 2018-07-08 DIAGNOSIS — E11621 Type 2 diabetes mellitus with foot ulcer: Secondary | ICD-10-CM | POA: Diagnosis not present

## 2018-07-08 DIAGNOSIS — M7552 Bursitis of left shoulder: Secondary | ICD-10-CM | POA: Diagnosis not present

## 2018-07-27 DIAGNOSIS — K219 Gastro-esophageal reflux disease without esophagitis: Secondary | ICD-10-CM | POA: Diagnosis not present

## 2018-07-27 DIAGNOSIS — N183 Chronic kidney disease, stage 3 (moderate): Secondary | ICD-10-CM | POA: Diagnosis not present

## 2018-07-27 DIAGNOSIS — R5383 Other fatigue: Secondary | ICD-10-CM | POA: Diagnosis not present

## 2018-07-27 DIAGNOSIS — I10 Essential (primary) hypertension: Secondary | ICD-10-CM | POA: Diagnosis not present

## 2018-07-27 DIAGNOSIS — E039 Hypothyroidism, unspecified: Secondary | ICD-10-CM | POA: Diagnosis not present

## 2018-07-27 DIAGNOSIS — R7301 Impaired fasting glucose: Secondary | ICD-10-CM | POA: Diagnosis not present

## 2018-07-27 DIAGNOSIS — E782 Mixed hyperlipidemia: Secondary | ICD-10-CM | POA: Diagnosis not present

## 2018-07-27 DIAGNOSIS — E1165 Type 2 diabetes mellitus with hyperglycemia: Secondary | ICD-10-CM | POA: Diagnosis not present

## 2018-07-30 DIAGNOSIS — I10 Essential (primary) hypertension: Secondary | ICD-10-CM | POA: Diagnosis not present

## 2018-07-30 DIAGNOSIS — Z23 Encounter for immunization: Secondary | ICD-10-CM | POA: Diagnosis not present

## 2018-07-30 DIAGNOSIS — E1122 Type 2 diabetes mellitus with diabetic chronic kidney disease: Secondary | ICD-10-CM | POA: Diagnosis not present

## 2018-07-30 DIAGNOSIS — I482 Chronic atrial fibrillation, unspecified: Secondary | ICD-10-CM | POA: Diagnosis not present

## 2018-07-30 DIAGNOSIS — K219 Gastro-esophageal reflux disease without esophagitis: Secondary | ICD-10-CM | POA: Diagnosis not present

## 2018-07-30 DIAGNOSIS — Z6823 Body mass index (BMI) 23.0-23.9, adult: Secondary | ICD-10-CM | POA: Diagnosis not present

## 2018-07-30 DIAGNOSIS — N183 Chronic kidney disease, stage 3 (moderate): Secondary | ICD-10-CM | POA: Diagnosis not present

## 2018-07-30 DIAGNOSIS — F331 Major depressive disorder, recurrent, moderate: Secondary | ICD-10-CM | POA: Diagnosis not present

## 2018-07-30 DIAGNOSIS — D692 Other nonthrombocytopenic purpura: Secondary | ICD-10-CM | POA: Diagnosis not present

## 2018-07-30 DIAGNOSIS — I5022 Chronic systolic (congestive) heart failure: Secondary | ICD-10-CM | POA: Diagnosis not present

## 2018-07-30 DIAGNOSIS — M1711 Unilateral primary osteoarthritis, right knee: Secondary | ICD-10-CM | POA: Diagnosis not present

## 2018-07-30 DIAGNOSIS — K439 Ventral hernia without obstruction or gangrene: Secondary | ICD-10-CM | POA: Diagnosis not present

## 2018-07-30 DIAGNOSIS — I130 Hypertensive heart and chronic kidney disease with heart failure and stage 1 through stage 4 chronic kidney disease, or unspecified chronic kidney disease: Secondary | ICD-10-CM | POA: Diagnosis not present

## 2018-07-30 DIAGNOSIS — Z0001 Encounter for general adult medical examination with abnormal findings: Secondary | ICD-10-CM | POA: Diagnosis not present

## 2018-07-30 DIAGNOSIS — I4891 Unspecified atrial fibrillation: Secondary | ICD-10-CM | POA: Diagnosis not present

## 2018-07-30 DIAGNOSIS — E782 Mixed hyperlipidemia: Secondary | ICD-10-CM | POA: Diagnosis not present

## 2018-08-11 DIAGNOSIS — M542 Cervicalgia: Secondary | ICD-10-CM | POA: Diagnosis not present

## 2018-08-11 DIAGNOSIS — M5030 Other cervical disc degeneration, unspecified cervical region: Secondary | ICD-10-CM | POA: Diagnosis not present

## 2018-08-19 DIAGNOSIS — E1122 Type 2 diabetes mellitus with diabetic chronic kidney disease: Secondary | ICD-10-CM | POA: Diagnosis not present

## 2018-08-19 DIAGNOSIS — N189 Chronic kidney disease, unspecified: Secondary | ICD-10-CM | POA: Diagnosis not present

## 2018-08-19 DIAGNOSIS — Z7901 Long term (current) use of anticoagulants: Secondary | ICD-10-CM | POA: Diagnosis not present

## 2018-08-19 DIAGNOSIS — I4891 Unspecified atrial fibrillation: Secondary | ICD-10-CM | POA: Diagnosis not present

## 2018-08-19 DIAGNOSIS — Z6825 Body mass index (BMI) 25.0-25.9, adult: Secondary | ICD-10-CM | POA: Diagnosis not present

## 2018-08-19 DIAGNOSIS — Z7984 Long term (current) use of oral hypoglycemic drugs: Secondary | ICD-10-CM | POA: Diagnosis not present

## 2018-08-19 DIAGNOSIS — I129 Hypertensive chronic kidney disease with stage 1 through stage 4 chronic kidney disease, or unspecified chronic kidney disease: Secondary | ICD-10-CM | POA: Diagnosis not present

## 2018-08-19 DIAGNOSIS — F330 Major depressive disorder, recurrent, mild: Secondary | ICD-10-CM | POA: Diagnosis not present

## 2018-09-21 DIAGNOSIS — I4891 Unspecified atrial fibrillation: Secondary | ICD-10-CM | POA: Diagnosis not present

## 2018-09-21 DIAGNOSIS — E1122 Type 2 diabetes mellitus with diabetic chronic kidney disease: Secondary | ICD-10-CM | POA: Diagnosis not present

## 2018-09-21 DIAGNOSIS — Z6833 Body mass index (BMI) 33.0-33.9, adult: Secondary | ICD-10-CM | POA: Diagnosis not present

## 2018-09-21 DIAGNOSIS — Z7901 Long term (current) use of anticoagulants: Secondary | ICD-10-CM | POA: Diagnosis not present

## 2018-09-21 DIAGNOSIS — N189 Chronic kidney disease, unspecified: Secondary | ICD-10-CM | POA: Diagnosis not present

## 2018-09-21 DIAGNOSIS — Z7984 Long term (current) use of oral hypoglycemic drugs: Secondary | ICD-10-CM | POA: Diagnosis not present

## 2018-09-21 DIAGNOSIS — I129 Hypertensive chronic kidney disease with stage 1 through stage 4 chronic kidney disease, or unspecified chronic kidney disease: Secondary | ICD-10-CM | POA: Diagnosis not present

## 2018-09-23 DIAGNOSIS — M1712 Unilateral primary osteoarthritis, left knee: Secondary | ICD-10-CM | POA: Diagnosis not present

## 2018-09-23 DIAGNOSIS — M170 Bilateral primary osteoarthritis of knee: Secondary | ICD-10-CM | POA: Diagnosis not present

## 2018-09-23 DIAGNOSIS — M1711 Unilateral primary osteoarthritis, right knee: Secondary | ICD-10-CM | POA: Diagnosis not present

## 2018-09-23 DIAGNOSIS — M25512 Pain in left shoulder: Secondary | ICD-10-CM | POA: Diagnosis not present

## 2018-10-18 DIAGNOSIS — I10 Essential (primary) hypertension: Secondary | ICD-10-CM | POA: Diagnosis not present

## 2018-10-18 DIAGNOSIS — N183 Chronic kidney disease, stage 3 (moderate): Secondary | ICD-10-CM | POA: Diagnosis not present

## 2018-10-18 DIAGNOSIS — R7301 Impaired fasting glucose: Secondary | ICD-10-CM | POA: Diagnosis not present

## 2018-10-18 DIAGNOSIS — R946 Abnormal results of thyroid function studies: Secondary | ICD-10-CM | POA: Diagnosis not present

## 2018-10-18 DIAGNOSIS — E538 Deficiency of other specified B group vitamins: Secondary | ICD-10-CM | POA: Diagnosis not present

## 2018-10-18 DIAGNOSIS — R5383 Other fatigue: Secondary | ICD-10-CM | POA: Diagnosis not present

## 2018-10-18 DIAGNOSIS — E782 Mixed hyperlipidemia: Secondary | ICD-10-CM | POA: Diagnosis not present

## 2018-10-18 DIAGNOSIS — E039 Hypothyroidism, unspecified: Secondary | ICD-10-CM | POA: Diagnosis not present

## 2018-10-18 DIAGNOSIS — E1165 Type 2 diabetes mellitus with hyperglycemia: Secondary | ICD-10-CM | POA: Diagnosis not present

## 2018-10-18 DIAGNOSIS — K219 Gastro-esophageal reflux disease without esophagitis: Secondary | ICD-10-CM | POA: Diagnosis not present

## 2018-10-25 NOTE — Progress Notes (Signed)
Cardiology Office Note  Date: 10/26/2018   ID: Aimee Miles, Aimee Miles [birth year], MRN [id number]  PCP:  Richardean Chimera, MD  Cardiologist:  Nona Dell, MD Electrophysiologist:  None   Chief Complaint  Patient presents with  . Cardiac follow-up    History of Present Illness: Aimee Miles is an 85 y.o. female last seen in February.  She is here for a routine visit.  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  She seems very focused on making sure that he stays at home as long as he can.  She is in a wheelchair today, states that she is active with ADLs but does have to pace herself.  I reviewed her medications which are outlined below and stable from a cardiac perspective.  She just had lab work obtained per Dr. Reuel Boom with follow-up visit pending.  She does not report any bleeding problems on Eliquis.  I personally reviewed her ECG today which shows atrial fibrillation with left bundle branch block.  Past Medical History:  Diagnosis Date  . Atrial fibrillation (HCC)   . Chronic systolic (congestive) heart failure (HCC)   . Hepatitis   . History of pulmonary embolus (PE)    Bilateral PE January 2015 on Coumadin until June 2015  . Hypothyroidism   . LBBB (left bundle branch block)     Past Surgical History:  Procedure Laterality Date  . CHOLECYSTECTOMY    . EYE SURGERY    . JOINT REPLACEMENT      Current Outpatient Medications  Medication Sig Dispense Refill  . acetaminophen (TYLENOL) 325 MG tablet Take 325 mg by mouth as needed.    Marland Kitchen apixaban (ELIQUIS) 2.5 MG TABS tablet Take 2.5 mg by mouth 2 (two) times daily.    . carvedilol (COREG) 12.5 MG tablet Take 12.5 mg by mouth 2 (two) times daily with a meal.    . clobetasol ointment (TEMOVATE) 0.05 % Apply 1 application topically 2 (two) times daily as needed.    . ferrous gluconate (FERGON) 324 MG tablet Take 324 mg by mouth daily with breakfast.    . folic acid (FOLVITE) 1 MG tablet  Take 1 mg by mouth daily. Reported on 09/14/2015    . furosemide (LASIX) 40 MG tablet Take 40 mg by mouth daily.    . isosorbide mononitrate (IMDUR) 30 MG 24 hr tablet Take 30 mg by mouth daily.    Marland Kitchen levothyroxine (SYNTHROID, LEVOTHROID) 100 MCG tablet Take 100 mcg by mouth daily.     Marland Kitchen losartan (COZAAR) 25 MG tablet Take 0.5 tablets (12.5 mg total) by mouth daily. 45 tablet 2  . methocarbamol (ROBAXIN) 500 MG tablet Take 500-1,000 mg by mouth 3 (three) times daily.    . Multiple Vitamins-Minerals (MULTIVITAMIN ADULTS PO) Take 1 tablet by mouth 2 (two) times daily. Solutions Rx Superior Women's Multivitamin    . omeprazole (PRILOSEC) 20 MG capsule Take 20 mg by mouth daily.    . potassium chloride (K-DUR) 10 MEQ tablet Take 10 mEq by mouth daily.    Marland Kitchen venlafaxine (EFFEXOR) 75 MG tablet Take 75 mg by mouth 2 (two) times daily with a meal.     No current facility-administered medications for this visit.    Allergies:  Codeine, Penicillins, and Sulfa antibiotics   Social History: The patient  reports that she has never smoked. She has never used smokeless tobacco. She reports that she does not drink alcohol or use drugs.   ROS:  Please see the history of present illness. Otherwise, complete review of systems is positive for stable arthritic symptoms.  All other systems are reviewed and negative.   Physical Exam: VS:  BP 126/65   Pulse 74   Ht 5' (1.524 m)   Wt 145 lb 6.4 oz (66 kg)   SpO2 97%   BMI 28.40 kg/m , BMI Body mass index is 28.4 kg/m.  Wt Readings from Last 3 Encounters:  10/26/18 145 lb 6.4 oz (66 kg)  04/22/18 157 lb (71.2 kg)  10/16/17 165 lb (74.8 kg)    General: Elderly woman, appears comfortable at rest.  In wheelchair. HEENT: Conjunctiva and lids normal, wearing a mask. Neck: Supple, no elevated JVP or carotid bruits, no thyromegaly. Lungs: Clear to auscultation, nonlabored breathing at rest. Cardiac: Irregularly irregular, no S3, soft systolic murmur. Abdomen:  Soft, nontender, bowel sounds present. Extremities: Venous stasis and mild lower leg edema, distal pulses 1-2+. Skin: Warm and dry. Musculoskeletal: Kyphosis. Neuropsychiatric: Alert and oriented x3, affect grossly appropriate.  ECG:  An ECG dated 10/16/2017 was personally reviewed today and demonstrated:  Atrial fibrillation with left bundle branch block.  Recent Labwork:  August 2019: BUN 23, creatinine 1.37, potassium 3.7, AST 21, ALT 18, cholesterol 128, triglycerides 131, HDL 46, LDL 56, hemoglobin A1c 7.1%  Other Studies Reviewed Today:  Echocardiogram7/27/2017: Study Conclusions  - Left ventricle: The cavity size was normal. Wall thickness was normal. Systolic function was moderately reduced. The estimated ejection fraction was in the range of 35% to 40%. Diffuse hypokinesis. The study is not technically sufficient to allow evaluation of LV diastolic function. - Ventricular septum: Septal motion showed paradox. - Aortic valve: Calcified annulus. Trileaflet. - Mitral valve: There was trivial regurgitation. - Left atrium: The atrium was moderately dilated. - Right atrium: Central venous pressure (est): 3 mm Hg. - Atrial septum: The septum bowed from left to right, consistent with increased left atrial pressure. No defect or patent foramen ovale was identified. - Tricuspid valve: There was moderate regurgitation. - Pulmonary arteries: PA peak pressure: 35 mm Hg (S). - Pericardium, extracardiac: There was no pericardial effusion.  Impressions:  - Normal LV wall thickness with LVEF 35-40%. There is diffuse hypokinesis with paradoxical septal motion consistent with left bundle branch block. Indeterminate diastolic function in the setting of atrial fibrillation. Moderate left atrial enlargement with evidence of increased left atrial pressure. Trivial mitral regurgitation. Mildly sclerotic aortic valve. Moderate tricuspid regurgitation with PASP 35  mmHg.  Assessment and Plan:  1.  Permanent atrial fibrillation.  She does not report any palpitations and heart rate control looks adequate today.  Continue Eliquis for stroke prophylaxis.  Requesting recent lab work from Dr. Quillian Quince.  2.  Nonischemic cardiomyopathy with LVEF 35 to 40%.  We are managing this conservatively.  Weight is down, she does not show any significant fluid excess at this time.  Continue Coreg, Lasix, Imdur, Cozaar, and potassium supplements.  3.  Chronic left bundle branch block.  4.  Hypothyroidism on Synthroid.  Keep follow-up with Dr. Quillian Quince.  Medication Adjustments/Labs and Tests Ordered: Current medicines are reviewed at length with the patient today.  Concerns regarding medicines are outlined above.   Tests Ordered: Orders Placed This Encounter  Procedures  . EKG 12-Lead    Medication Changes: No orders of the defined types were placed in this encounter.   Disposition:  Follow up 6 months in the [place] office.  Signed, Satira Sark, MD, [facility] 10/26/2018 2:20 PM  [facility] [place], [place], [place] [address] Phone: [phone]; Fax: [phone]

## 2018-10-26 ENCOUNTER — Other Ambulatory Visit: Payer: Self-pay

## 2018-10-26 ENCOUNTER — Ambulatory Visit: Payer: Medicare HMO | Admitting: Cardiology

## 2018-10-26 ENCOUNTER — Encounter: Payer: Self-pay | Admitting: *Deleted

## 2018-10-26 ENCOUNTER — Encounter: Payer: Self-pay | Admitting: Cardiology

## 2018-10-26 VITALS — BP 126/65 | HR 74 | Ht 60.0 in | Wt 145.4 lb

## 2018-10-26 DIAGNOSIS — I428 Other cardiomyopathies: Secondary | ICD-10-CM | POA: Diagnosis not present

## 2018-10-26 DIAGNOSIS — I4821 Permanent atrial fibrillation: Secondary | ICD-10-CM

## 2018-10-26 DIAGNOSIS — I447 Left bundle-branch block, unspecified: Secondary | ICD-10-CM | POA: Diagnosis not present

## 2018-10-26 NOTE — Patient Instructions (Addendum)

## 2018-10-28 DIAGNOSIS — M1711 Unilateral primary osteoarthritis, right knee: Secondary | ICD-10-CM | POA: Diagnosis not present

## 2018-10-28 DIAGNOSIS — F331 Major depressive disorder, recurrent, moderate: Secondary | ICD-10-CM | POA: Diagnosis not present

## 2018-10-28 DIAGNOSIS — I5022 Chronic systolic (congestive) heart failure: Secondary | ICD-10-CM | POA: Diagnosis not present

## 2018-10-28 DIAGNOSIS — Z6823 Body mass index (BMI) 23.0-23.9, adult: Secondary | ICD-10-CM | POA: Diagnosis not present

## 2018-10-28 DIAGNOSIS — K219 Gastro-esophageal reflux disease without esophagitis: Secondary | ICD-10-CM | POA: Diagnosis not present

## 2018-10-28 DIAGNOSIS — E1122 Type 2 diabetes mellitus with diabetic chronic kidney disease: Secondary | ICD-10-CM | POA: Diagnosis not present

## 2018-10-28 DIAGNOSIS — D692 Other nonthrombocytopenic purpura: Secondary | ICD-10-CM | POA: Diagnosis not present

## 2018-10-28 DIAGNOSIS — E1142 Type 2 diabetes mellitus with diabetic polyneuropathy: Secondary | ICD-10-CM | POA: Diagnosis not present

## 2018-10-28 DIAGNOSIS — I10 Essential (primary) hypertension: Secondary | ICD-10-CM | POA: Diagnosis not present

## 2018-10-28 DIAGNOSIS — N183 Chronic kidney disease, stage 3 (moderate): Secondary | ICD-10-CM | POA: Diagnosis not present

## 2018-10-28 DIAGNOSIS — I482 Chronic atrial fibrillation, unspecified: Secondary | ICD-10-CM | POA: Diagnosis not present

## 2018-10-28 DIAGNOSIS — I4891 Unspecified atrial fibrillation: Secondary | ICD-10-CM | POA: Diagnosis not present

## 2018-11-15 DIAGNOSIS — Z23 Encounter for immunization: Secondary | ICD-10-CM | POA: Diagnosis not present

## 2019-01-25 DIAGNOSIS — I10 Essential (primary) hypertension: Secondary | ICD-10-CM | POA: Diagnosis not present

## 2019-01-25 DIAGNOSIS — R5383 Other fatigue: Secondary | ICD-10-CM | POA: Diagnosis not present

## 2019-01-25 DIAGNOSIS — K219 Gastro-esophageal reflux disease without esophagitis: Secondary | ICD-10-CM | POA: Diagnosis not present

## 2019-01-25 DIAGNOSIS — E782 Mixed hyperlipidemia: Secondary | ICD-10-CM | POA: Diagnosis not present

## 2019-01-25 DIAGNOSIS — I5022 Chronic systolic (congestive) heart failure: Secondary | ICD-10-CM | POA: Diagnosis not present

## 2019-01-25 DIAGNOSIS — E1122 Type 2 diabetes mellitus with diabetic chronic kidney disease: Secondary | ICD-10-CM | POA: Diagnosis not present

## 2019-01-28 DIAGNOSIS — I10 Essential (primary) hypertension: Secondary | ICD-10-CM | POA: Diagnosis not present

## 2019-01-28 DIAGNOSIS — M2041 Other hammer toe(s) (acquired), right foot: Secondary | ICD-10-CM | POA: Diagnosis not present

## 2019-01-28 DIAGNOSIS — K219 Gastro-esophageal reflux disease without esophagitis: Secondary | ICD-10-CM | POA: Diagnosis not present

## 2019-01-28 DIAGNOSIS — I5022 Chronic systolic (congestive) heart failure: Secondary | ICD-10-CM | POA: Diagnosis not present

## 2019-01-28 DIAGNOSIS — F331 Major depressive disorder, recurrent, moderate: Secondary | ICD-10-CM | POA: Diagnosis not present

## 2019-01-28 DIAGNOSIS — Z1331 Encounter for screening for depression: Secondary | ICD-10-CM | POA: Diagnosis not present

## 2019-01-28 DIAGNOSIS — Z1389 Encounter for screening for other disorder: Secondary | ICD-10-CM | POA: Diagnosis not present

## 2019-02-22 DIAGNOSIS — L97511 Non-pressure chronic ulcer of other part of right foot limited to breakdown of skin: Secondary | ICD-10-CM | POA: Diagnosis not present

## 2019-03-03 DIAGNOSIS — K219 Gastro-esophageal reflux disease without esophagitis: Secondary | ICD-10-CM | POA: Diagnosis not present

## 2019-03-03 DIAGNOSIS — E785 Hyperlipidemia, unspecified: Secondary | ICD-10-CM | POA: Diagnosis not present

## 2019-03-03 DIAGNOSIS — E440 Moderate protein-calorie malnutrition: Secondary | ICD-10-CM | POA: Diagnosis not present

## 2019-03-03 DIAGNOSIS — Z6822 Body mass index (BMI) 22.0-22.9, adult: Secondary | ICD-10-CM | POA: Diagnosis not present

## 2019-03-03 DIAGNOSIS — F3342 Major depressive disorder, recurrent, in full remission: Secondary | ICD-10-CM | POA: Diagnosis not present

## 2019-03-03 DIAGNOSIS — I071 Rheumatic tricuspid insufficiency: Secondary | ICD-10-CM | POA: Diagnosis not present

## 2019-03-03 DIAGNOSIS — E1165 Type 2 diabetes mellitus with hyperglycemia: Secondary | ICD-10-CM | POA: Diagnosis not present

## 2019-03-03 DIAGNOSIS — Z7901 Long term (current) use of anticoagulants: Secondary | ICD-10-CM | POA: Diagnosis not present

## 2019-03-03 DIAGNOSIS — E261 Secondary hyperaldosteronism: Secondary | ICD-10-CM | POA: Diagnosis not present

## 2019-03-03 DIAGNOSIS — F330 Major depressive disorder, recurrent, mild: Secondary | ICD-10-CM | POA: Diagnosis not present

## 2019-03-03 DIAGNOSIS — E039 Hypothyroidism, unspecified: Secondary | ICD-10-CM | POA: Diagnosis not present

## 2019-03-03 DIAGNOSIS — E1159 Type 2 diabetes mellitus with other circulatory complications: Secondary | ICD-10-CM | POA: Diagnosis not present

## 2019-03-03 DIAGNOSIS — E1122 Type 2 diabetes mellitus with diabetic chronic kidney disease: Secondary | ICD-10-CM | POA: Diagnosis not present

## 2019-03-03 DIAGNOSIS — E1169 Type 2 diabetes mellitus with other specified complication: Secondary | ICD-10-CM | POA: Diagnosis not present

## 2019-03-03 DIAGNOSIS — M159 Polyosteoarthritis, unspecified: Secondary | ICD-10-CM | POA: Diagnosis not present

## 2019-03-03 DIAGNOSIS — I130 Hypertensive heart and chronic kidney disease with heart failure and stage 1 through stage 4 chronic kidney disease, or unspecified chronic kidney disease: Secondary | ICD-10-CM | POA: Diagnosis not present

## 2019-03-22 DIAGNOSIS — M79676 Pain in unspecified toe(s): Secondary | ICD-10-CM | POA: Diagnosis not present

## 2019-03-22 DIAGNOSIS — E1142 Type 2 diabetes mellitus with diabetic polyneuropathy: Secondary | ICD-10-CM | POA: Diagnosis not present

## 2019-03-22 DIAGNOSIS — B351 Tinea unguium: Secondary | ICD-10-CM | POA: Diagnosis not present

## 2019-03-22 DIAGNOSIS — L84 Corns and callosities: Secondary | ICD-10-CM | POA: Diagnosis not present

## 2019-03-28 ENCOUNTER — Telehealth (INDEPENDENT_AMBULATORY_CARE_PROVIDER_SITE_OTHER): Payer: Self-pay | Admitting: Licensed Clinical Social Worker

## 2019-03-28 DIAGNOSIS — F339 Major depressive disorder, recurrent, unspecified: Secondary | ICD-10-CM

## 2019-03-28 NOTE — BH Specialist Note (Signed)
Integrated Behavioral Health Treatment Planning Team  MRN: [id number] NAME: Aimee Miles  DATE: 03/21/19  Start time:  930a End time: 945a Total time: 15 Total number of Virtual BH Treatment Team Plan encounters: 1/4  Treatment Team Attendees: Nolon Rod, LCSW, [place], [place], Dr. Daleen Bo, Psychiatrist  Diagnoses: No diagnosis found.  Goals, Interventions and Follow-up Plan Goals: Patient will:  participated in [facility] and reduce symptoms of:   depression and also Increase knowledge and/or ability WN:[id number] skills    Interventions:   Brief CBT Medication Management Recommendations: 10mg  Prozac Follow up Plan: weekly contact   Psyachiatric History  Depression: Yes Anxiety: Yes Mania: No Psychosis: No PTSD symptoms: No Psychiatric History: is currently in counseling  Past Psychiatric History/Hospitalization(s): Hospitalization for psychiatric illness: No Prior Suicide Attempts: No Prior Self-injurious behavior: No Previous Treatment: [facility] treatment with [facility]  Treatment Barriers: limited support, MH symptoms Strengths/Protective Factors: motivated for treatment   Social History:  Social History   Socioeconomic History  . Marital status: Married    Spouse name: Not on file  . Number of children: Not on file  . Years of education: Not on file  . Highest education level: Not on file  Occupational History  . Not on file  Tobacco Use  . Smoking status: Never Smoker  . Smokeless tobacco: Never Used  Substance and Sexual Activity  . Alcohol use: No    Alcohol/week: 0.0 standard drinks  . Drug use: No  . Sexual activity: Not on file  Other Topics Concern  . Not on file  Social History Narrative  . Not on file   Social Determinants of Health   Financial Resource Strain:   . Difficulty of Paying Living Expenses: Not on file  Food Insecurity:   . Worried About Elisabeth Most in the Last Year: Not on file  . Ran Out of Food in the  Last Year: Not on file  Transportation Needs:   . Lack of Transportation (Medical): Not on file  . Lack of Transportation (Non-Medical): Not on file  Physical Activity:   . Days of Exercise per Week: Not on file  . Minutes of Exercise per Session: Not on file  Stress:   . Feeling of Stress : Not on file  Social Connections:   . Frequency of Communication with Friends and Family: Not on file  . Frequency of Social Gatherings with Friends and Family: Not on file  . Attends Religious Services: Not on file  . Active Member of Clubs or Organizations: Not on file  . Attends Programme researcher, broadcasting/film/video Meetings: Not on file  . Marital Status: Not on file    Past Medical History Medical History:  Past Medical History:  Diagnosis Date  . Atrial fibrillation (HCC)   . Chronic systolic (congestive) heart failure (HCC)   . Hepatitis   . History of pulmonary embolus (PE)    Bilateral PE January 2015 on Coumadin until June 2015  . Hypothyroidism   . LBBB (left bundle branch block)    Allergies:  Allergies as of 03/28/2019 - Review Complete 10/26/2018  Allergen Reaction Noted  . Codeine Nausea And Vomiting   . Penicillins Hives   . Sulfa antibiotics Rash     Medication History: Current medications:  Outpatient Encounter Medications as of 03/28/2019  Medication Sig  . acetaminophen (TYLENOL) 325 MG tablet Take 325 mg by mouth as needed.  03/30/2019 apixaban (ELIQUIS) 2.5 MG TABS tablet Take 2.5 mg by mouth 2 (two)  times daily.  . carvedilol (COREG) 12.5 MG tablet Take 12.5 mg by mouth 2 (two) times daily with a meal.  . clobetasol ointment (TEMOVATE) 1.19 % Apply 1 application topically 2 (two) times daily as needed.  . ferrous gluconate (FERGON) 324 MG tablet Take 324 mg by mouth daily with breakfast.  . folic acid (FOLVITE) 1 MG tablet Take 1 mg by mouth daily. Reported on 09/14/2015  . furosemide (LASIX) 40 MG tablet Take 40 mg by mouth daily.  . isosorbide mononitrate (IMDUR) 30 MG 24 hr tablet  Take 30 mg by mouth daily.  Marland Kitchen levothyroxine (SYNTHROID, LEVOTHROID) 100 MCG tablet Take 100 mcg by mouth daily.   Marland Kitchen losartan (COZAAR) 25 MG tablet Take 0.5 tablets (12.5 mg total) by mouth daily.  . methocarbamol (ROBAXIN) 500 MG tablet Take 500-1,000 mg by mouth 3 (three) times daily.  . Multiple Vitamins-Minerals (MULTIVITAMIN ADULTS PO) Take 1 tablet by mouth 2 (two) times daily. Solutions Rx Superior Women's Multivitamin  . omeprazole (PRILOSEC) 20 MG capsule Take 20 mg by mouth daily.  . potassium chloride (K-DUR) 10 MEQ tablet Take 10 mEq by mouth daily.  Marland Kitchen venlafaxine (EFFEXOR) 75 MG tablet Take 75 mg by mouth 2 (two) times daily with a meal.   No facility-administered encounter medications on file as of 03/28/2019.      Scribe for Treatment Team: Lubertha South, LCSW

## 2019-04-02 DIAGNOSIS — Z20828 Contact with and (suspected) exposure to other viral communicable diseases: Secondary | ICD-10-CM | POA: Diagnosis not present

## 2019-04-28 DIAGNOSIS — E1165 Type 2 diabetes mellitus with hyperglycemia: Secondary | ICD-10-CM | POA: Diagnosis not present

## 2019-04-28 DIAGNOSIS — E1142 Type 2 diabetes mellitus with diabetic polyneuropathy: Secondary | ICD-10-CM | POA: Diagnosis not present

## 2019-04-28 DIAGNOSIS — E782 Mixed hyperlipidemia: Secondary | ICD-10-CM | POA: Diagnosis not present

## 2019-04-28 DIAGNOSIS — E1122 Type 2 diabetes mellitus with diabetic chronic kidney disease: Secondary | ICD-10-CM | POA: Diagnosis not present

## 2019-04-28 DIAGNOSIS — R7301 Impaired fasting glucose: Secondary | ICD-10-CM | POA: Diagnosis not present

## 2019-04-28 DIAGNOSIS — K219 Gastro-esophageal reflux disease without esophagitis: Secondary | ICD-10-CM | POA: Diagnosis not present

## 2019-04-28 DIAGNOSIS — I10 Essential (primary) hypertension: Secondary | ICD-10-CM | POA: Diagnosis not present

## 2019-04-28 DIAGNOSIS — E039 Hypothyroidism, unspecified: Secondary | ICD-10-CM | POA: Diagnosis not present

## 2019-05-03 DIAGNOSIS — Z6821 Body mass index (BMI) 21.0-21.9, adult: Secondary | ICD-10-CM | POA: Diagnosis not present

## 2019-05-03 DIAGNOSIS — I10 Essential (primary) hypertension: Secondary | ICD-10-CM | POA: Diagnosis not present

## 2019-05-03 DIAGNOSIS — M2041 Other hammer toe(s) (acquired), right foot: Secondary | ICD-10-CM | POA: Diagnosis not present

## 2019-05-03 DIAGNOSIS — K439 Ventral hernia without obstruction or gangrene: Secondary | ICD-10-CM | POA: Diagnosis not present

## 2019-05-03 DIAGNOSIS — I5022 Chronic systolic (congestive) heart failure: Secondary | ICD-10-CM | POA: Diagnosis not present

## 2019-05-03 DIAGNOSIS — E1142 Type 2 diabetes mellitus with diabetic polyneuropathy: Secondary | ICD-10-CM | POA: Diagnosis not present

## 2019-05-03 DIAGNOSIS — E1122 Type 2 diabetes mellitus with diabetic chronic kidney disease: Secondary | ICD-10-CM | POA: Diagnosis not present

## 2019-05-16 DIAGNOSIS — E039 Hypothyroidism, unspecified: Secondary | ICD-10-CM | POA: Diagnosis not present

## 2019-05-16 DIAGNOSIS — N2581 Secondary hyperparathyroidism of renal origin: Secondary | ICD-10-CM | POA: Diagnosis not present

## 2019-05-16 DIAGNOSIS — Z993 Dependence on wheelchair: Secondary | ICD-10-CM | POA: Diagnosis not present

## 2019-05-16 DIAGNOSIS — E261 Secondary hyperaldosteronism: Secondary | ICD-10-CM | POA: Diagnosis not present

## 2019-05-16 DIAGNOSIS — I5022 Chronic systolic (congestive) heart failure: Secondary | ICD-10-CM | POA: Diagnosis not present

## 2019-05-16 DIAGNOSIS — N183 Chronic kidney disease, stage 3 unspecified: Secondary | ICD-10-CM | POA: Diagnosis not present

## 2019-05-16 DIAGNOSIS — D692 Other nonthrombocytopenic purpura: Secondary | ICD-10-CM | POA: Diagnosis not present

## 2019-05-16 DIAGNOSIS — F330 Major depressive disorder, recurrent, mild: Secondary | ICD-10-CM | POA: Diagnosis not present

## 2019-05-16 DIAGNOSIS — I4891 Unspecified atrial fibrillation: Secondary | ICD-10-CM | POA: Diagnosis not present

## 2019-05-16 DIAGNOSIS — E440 Moderate protein-calorie malnutrition: Secondary | ICD-10-CM | POA: Diagnosis not present

## 2019-05-31 DIAGNOSIS — L84 Corns and callosities: Secondary | ICD-10-CM | POA: Diagnosis not present

## 2019-05-31 DIAGNOSIS — M79676 Pain in unspecified toe(s): Secondary | ICD-10-CM | POA: Diagnosis not present

## 2019-05-31 DIAGNOSIS — B351 Tinea unguium: Secondary | ICD-10-CM | POA: Diagnosis not present

## 2019-05-31 DIAGNOSIS — E1142 Type 2 diabetes mellitus with diabetic polyneuropathy: Secondary | ICD-10-CM | POA: Diagnosis not present

## 2019-07-11 ENCOUNTER — Other Ambulatory Visit: Payer: Self-pay

## 2019-07-11 ENCOUNTER — Encounter: Payer: Self-pay | Admitting: Cardiology

## 2019-07-11 ENCOUNTER — Ambulatory Visit: Payer: Medicare HMO | Admitting: Cardiology

## 2019-07-11 ENCOUNTER — Encounter: Payer: Self-pay | Admitting: *Deleted

## 2019-07-11 VITALS — BP 110/70 | HR 68 | Ht 60.0 in | Wt 136.0 lb

## 2019-07-11 DIAGNOSIS — I4821 Permanent atrial fibrillation: Secondary | ICD-10-CM | POA: Diagnosis not present

## 2019-07-11 DIAGNOSIS — I428 Other cardiomyopathies: Secondary | ICD-10-CM | POA: Diagnosis not present

## 2019-07-11 NOTE — Progress Notes (Signed)
Cardiology Office Note  Date: 07/11/2019   ID: LEONI Miles, DOB September 12, 1930, MRN [id number]  PCP:  Caryl Bis, MD  Cardiologist:  Rozann Lesches, MD Electrophysiologist:  None   Chief Complaint  Patient presents with  . Cardiac follow-up    History of Present Illness: Aimee Miles is an 86 y.o. female last seen in August 2020.  She is here today with her daughter for follow-up visit.  She tells me that her husband passed away back in 04-27-22 after declining health and hospice at home.  They had been married for 39 years.  She has good support from her family.  She does not report any palpitations, no shortness of breath at rest.  She is following closely with her PCP Dr. Quillian Quince and has lab work pending later this month.  I reviewed her medications which are outlined below.  Past Medical History:  Diagnosis Date  . Atrial fibrillation ([place])   . Chronic systolic (congestive) heart failure ([place])   . Hepatitis   . History of pulmonary embolus (PE)    Bilateral PE January 2015 on Coumadin until June 2015  . Hypothyroidism   . LBBB (left bundle branch block)     Past Surgical History:  Procedure Laterality Date  . CHOLECYSTECTOMY    . EYE SURGERY    . JOINT REPLACEMENT      Current Outpatient Medications  Medication Sig Dispense Refill  . acetaminophen (TYLENOL 8 HOUR ARTHRITIS PAIN) 650 MG CR tablet Take 1,300 mg by mouth 2 (two) times daily as needed for pain.    Marland Kitchen acetaminophen (TYLENOL) 325 MG tablet Take 325 mg by mouth as needed.    Marland Kitchen apixaban (ELIQUIS) 2.5 MG TABS tablet Take 2.5 mg by mouth 2 (two) times daily.    Marland Kitchen atorvastatin (LIPITOR) 10 MG tablet Take 1 tablet by mouth daily.    . carvedilol (COREG) 12.5 MG tablet Take 12.5 mg by mouth 2 (two) times daily with a meal.    . clobetasol ointment (TEMOVATE) 6.94 % Apply 1 application topically 2 (two) times daily as needed.    . folic acid (FOLVITE) 1 MG tablet Take 1 mg by mouth daily. Reported on  09/14/2015    . furosemide (LASIX) 40 MG tablet Take 40 mg by mouth daily.    Marland Kitchen glipiZIDE (GLUCOTROL XL) 2.5 MG 24 hr tablet Take 1 tablet by mouth daily.    . isosorbide mononitrate (IMDUR) 30 MG 24 hr tablet Take 30 mg by mouth daily.    Marland Kitchen levothyroxine (SYNTHROID, LEVOTHROID) 100 MCG tablet Take 100 mcg by mouth daily.     Marland Kitchen losartan (COZAAR) 25 MG tablet Take 0.5 tablets (12.5 mg total) by mouth daily. 45 tablet 2  . methocarbamol (ROBAXIN) 500 MG tablet Take 500-1,000 mg by mouth 3 (three) times daily.    . Multiple Vitamins-Minerals (MULTIVITAMIN ADULTS PO) Take 1 tablet by mouth 2 (two) times daily. Solutions Rx Superior Women's Multivitamin    . omeprazole (PRILOSEC) 20 MG capsule Take 20 mg by mouth daily.    . potassium chloride (K-DUR) 10 MEQ tablet Take 10 mEq by mouth daily.    Marland Kitchen venlafaxine (EFFEXOR) 75 MG tablet Take 75 mg by mouth 2 (two) times daily with a meal.     No current facility-administered medications for this visit.   Allergies:  Codeine, Penicillins, and Sulfa antibiotics   ROS:  Hearing loss.  Arthritic pains.  Physical Exam: VS:  BP 110/70  Pulse 68   Ht 5' (1.524 m)   Wt 136 lb (61.7 kg)   SpO2 98%   BMI 26.56 kg/m , BMI Body mass index is 26.56 kg/m.  Wt Readings from Last 3 Encounters:  07/11/19 136 lb (61.7 kg)  10/26/18 145 lb 6.4 oz (66 kg)  04/22/18 157 lb (71.2 kg)    General:  Elderly woman, appears comfortable at rest.  In wheelchair. HEENT: Conjunctiva and lids normal, wearing a mask. Neck: Supple, no elevated JVP or carotid bruits, no thyromegaly. Lungs: Clear to auscultation, nonlabored breathing at rest. Cardiac: Distant, irregularly irregular, no S3, soft systolic murmur. Extremities: Venous stasis.  ECG:  An ECG dated 10/26/2018 was personally reviewed today and demonstrated:  Atrial fibrillation with left bundle branch block.  Recent Labwork:  August 2020: Hemoglobin 11.9, platelets 169 UN 17, creatinine 1.2, potassium 3.4, AST  21, ALT 32, cholesterol 146, triglycerides 87, HDL 52, LDL 77, TSH 5.02, hemoglobin A1c 7.5%  Other Studies Reviewed Today:  Echocardiogram7/27/2017: Study Conclusions  - Left ventricle: The cavity size was normal. Wall thickness was normal. Systolic function was moderately reduced. The estimated ejection fraction was in the range of 35% to 40%. Diffuse hypokinesis. The study is not technically sufficient to allow evaluation of LV diastolic function. - Ventricular septum: Septal motion showed paradox. - Aortic valve: Calcified annulus. Trileaflet. - Mitral valve: There was trivial regurgitation. - Left atrium: The atrium was moderately dilated. - Right atrium: Central venous pressure (est): 3 mm Hg. - Atrial septum: The septum bowed from left to right, consistent with increased left atrial pressure. No defect or patent foramen ovale was identified. - Tricuspid valve: There was moderate regurgitation. - Pulmonary arteries: PA peak pressure: 35 mm Hg (S). - Pericardium, extracardiac: There was no pericardial effusion.  Impressions:  - Normal LV wall thickness with LVEF 35-40%. There is diffuse hypokinesis with paradoxical septal motion consistent with left bundle branch block. Indeterminate diastolic function in the setting of atrial fibrillation. Moderate left atrial enlargement with evidence of increased left atrial pressure. Trivial mitral regurgitation. Mildly sclerotic aortic valve. Moderate tricuspid regurgitation with PASP 35 mmHg.  Assessment and Plan:  1.  Permanent atrial fibrillation.  No active palpitations, heart rate control looks good on Coreg and she otherwise continues on Eliquis for stroke prophylaxis.  She is due for follow-up lab work with PCP later this month.  No reported bleeding episodes.  2.  Nonischemic cardiomyopathy, LVEF 35 to 40%.  Continue conservative management.  Weight is down compared to last year's visit.  Continue  Coreg, losartan, Lasix, and potassium supplements.  Medication Adjustments/Labs and Tests Ordered: Current medicines are reviewed at length with the patient today.  Concerns regarding medicines are outlined above.   Tests Ordered: No orders of the defined types were placed in this encounter.   Medication Changes: No orders of the defined types were placed in this encounter.   Disposition:  Follow up 6 months in the [place] office.  Signed, Jonelle Sidle, MD, [facility] 07/11/2019 4:45 PM    [facility] at [facility] [address] [place], [place], [place] [address] Phone: [phone]; Fax: [phone]

## 2019-07-11 NOTE — Patient Instructions (Addendum)
Medication Instructions:    Your physician recommends that you continue on your current medications as directed. Please refer to the Current Medication list given to you today.  Labwork:  NONE  Testing/Procedures:  NONE  Follow-Up:  Your physician recommends that you schedule a follow-up appointment in: 6 months (office)  Any Other Special Instructions Will Be Listed Below (If Applicable).  If you need a refill on your cardiac medications before your next appointment, please call your pharmacy. 

## 2019-07-25 DIAGNOSIS — H524 Presbyopia: Secondary | ICD-10-CM | POA: Diagnosis not present

## 2019-07-29 DIAGNOSIS — E1122 Type 2 diabetes mellitus with diabetic chronic kidney disease: Secondary | ICD-10-CM | POA: Diagnosis not present

## 2019-07-29 DIAGNOSIS — R946 Abnormal results of thyroid function studies: Secondary | ICD-10-CM | POA: Diagnosis not present

## 2019-07-29 DIAGNOSIS — K219 Gastro-esophageal reflux disease without esophagitis: Secondary | ICD-10-CM | POA: Diagnosis not present

## 2019-07-29 DIAGNOSIS — R5383 Other fatigue: Secondary | ICD-10-CM | POA: Diagnosis not present

## 2019-07-29 DIAGNOSIS — D519 Vitamin B12 deficiency anemia, unspecified: Secondary | ICD-10-CM | POA: Diagnosis not present

## 2019-07-29 DIAGNOSIS — N183 Chronic kidney disease, stage 3 unspecified: Secondary | ICD-10-CM | POA: Diagnosis not present

## 2019-07-29 DIAGNOSIS — E782 Mixed hyperlipidemia: Secondary | ICD-10-CM | POA: Diagnosis not present

## 2019-07-29 DIAGNOSIS — E039 Hypothyroidism, unspecified: Secondary | ICD-10-CM | POA: Diagnosis not present

## 2019-07-29 DIAGNOSIS — D513 Other dietary vitamin B12 deficiency anemia: Secondary | ICD-10-CM | POA: Diagnosis not present

## 2019-07-29 DIAGNOSIS — D529 Folate deficiency anemia, unspecified: Secondary | ICD-10-CM | POA: Diagnosis not present

## 2019-07-29 DIAGNOSIS — I10 Essential (primary) hypertension: Secondary | ICD-10-CM | POA: Diagnosis not present

## 2019-07-29 DIAGNOSIS — D649 Anemia, unspecified: Secondary | ICD-10-CM | POA: Diagnosis not present

## 2019-08-02 DIAGNOSIS — I5022 Chronic systolic (congestive) heart failure: Secondary | ICD-10-CM | POA: Diagnosis not present

## 2019-08-02 DIAGNOSIS — I2699 Other pulmonary embolism without acute cor pulmonale: Secondary | ICD-10-CM | POA: Diagnosis not present

## 2019-08-02 DIAGNOSIS — D6869 Other thrombophilia: Secondary | ICD-10-CM | POA: Diagnosis not present

## 2019-08-02 DIAGNOSIS — M2041 Other hammer toe(s) (acquired), right foot: Secondary | ICD-10-CM | POA: Diagnosis not present

## 2019-08-02 DIAGNOSIS — I10 Essential (primary) hypertension: Secondary | ICD-10-CM | POA: Diagnosis not present

## 2019-08-02 DIAGNOSIS — E1142 Type 2 diabetes mellitus with diabetic polyneuropathy: Secondary | ICD-10-CM | POA: Diagnosis not present

## 2019-08-02 DIAGNOSIS — E1122 Type 2 diabetes mellitus with diabetic chronic kidney disease: Secondary | ICD-10-CM | POA: Diagnosis not present

## 2019-08-02 DIAGNOSIS — Z0001 Encounter for general adult medical examination with abnormal findings: Secondary | ICD-10-CM | POA: Diagnosis not present

## 2019-08-02 DIAGNOSIS — K439 Ventral hernia without obstruction or gangrene: Secondary | ICD-10-CM | POA: Diagnosis not present

## 2019-08-23 DIAGNOSIS — L84 Corns and callosities: Secondary | ICD-10-CM | POA: Diagnosis not present

## 2019-08-23 DIAGNOSIS — B351 Tinea unguium: Secondary | ICD-10-CM | POA: Diagnosis not present

## 2019-08-23 DIAGNOSIS — E1142 Type 2 diabetes mellitus with diabetic polyneuropathy: Secondary | ICD-10-CM | POA: Diagnosis not present

## 2019-08-23 DIAGNOSIS — M79676 Pain in unspecified toe(s): Secondary | ICD-10-CM | POA: Diagnosis not present

## 2019-09-15 DIAGNOSIS — E440 Moderate protein-calorie malnutrition: Secondary | ICD-10-CM | POA: Diagnosis not present

## 2019-09-15 DIAGNOSIS — Z993 Dependence on wheelchair: Secondary | ICD-10-CM | POA: Diagnosis not present

## 2019-09-15 DIAGNOSIS — E1122 Type 2 diabetes mellitus with diabetic chronic kidney disease: Secondary | ICD-10-CM | POA: Diagnosis not present

## 2019-09-15 DIAGNOSIS — I130 Hypertensive heart and chronic kidney disease with heart failure and stage 1 through stage 4 chronic kidney disease, or unspecified chronic kidney disease: Secondary | ICD-10-CM | POA: Diagnosis not present

## 2019-09-15 DIAGNOSIS — D692 Other nonthrombocytopenic purpura: Secondary | ICD-10-CM | POA: Diagnosis not present

## 2019-09-15 DIAGNOSIS — Z6823 Body mass index (BMI) 23.0-23.9, adult: Secondary | ICD-10-CM | POA: Diagnosis not present

## 2019-09-15 DIAGNOSIS — M159 Polyosteoarthritis, unspecified: Secondary | ICD-10-CM | POA: Diagnosis not present

## 2019-09-15 DIAGNOSIS — I5022 Chronic systolic (congestive) heart failure: Secondary | ICD-10-CM | POA: Diagnosis not present

## 2019-09-15 DIAGNOSIS — I071 Rheumatic tricuspid insufficiency: Secondary | ICD-10-CM | POA: Diagnosis not present

## 2019-09-15 DIAGNOSIS — I4891 Unspecified atrial fibrillation: Secondary | ICD-10-CM | POA: Diagnosis not present

## 2019-09-15 DIAGNOSIS — K219 Gastro-esophageal reflux disease without esophagitis: Secondary | ICD-10-CM | POA: Diagnosis not present

## 2019-09-15 DIAGNOSIS — E261 Secondary hyperaldosteronism: Secondary | ICD-10-CM | POA: Diagnosis not present

## 2019-09-15 DIAGNOSIS — Z7901 Long term (current) use of anticoagulants: Secondary | ICD-10-CM | POA: Diagnosis not present

## 2019-09-15 DIAGNOSIS — F330 Major depressive disorder, recurrent, mild: Secondary | ICD-10-CM | POA: Diagnosis not present

## 2019-09-15 DIAGNOSIS — N2581 Secondary hyperparathyroidism of renal origin: Secondary | ICD-10-CM | POA: Diagnosis not present

## 2019-10-07 DIAGNOSIS — M19012 Primary osteoarthritis, left shoulder: Secondary | ICD-10-CM | POA: Diagnosis not present

## 2019-10-13 IMAGING — CT CT MAXILLOFACIAL W/O CM
3 series · 12 of 47 positions shown, 14 images · non-contrast
Comparison: [HOSPITAL] Danial Boakye CT without contrast
01/14/2015, cervical spine CT 02/28/2014.

CLINICAL DATA: 87-year-old female with chronic sinusitis, non
intractable headache.

EXAM:
CT MAXILLOFACIAL WITHOUT CONTRAST
TECHNIQUE: Multidetector CT images of the paranasal sinuses were obtained using
the standard protocol without intravenous contrast.

[Series 2: standard · axial · 0.42mm/px · z∈[-30,+58]mm · 6 of 111 slices shown, 8 images]
[im 12/111  brain]
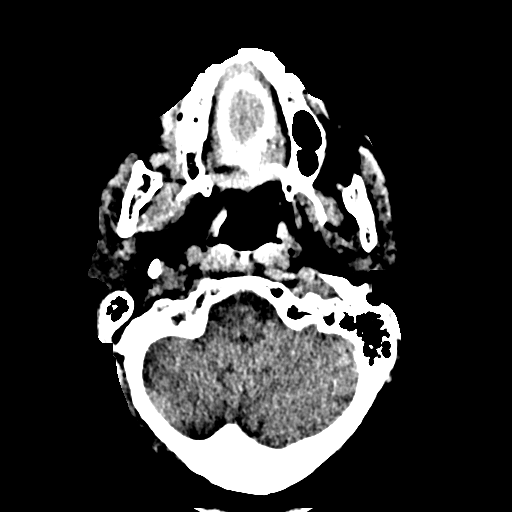
[im 12/111  bone]
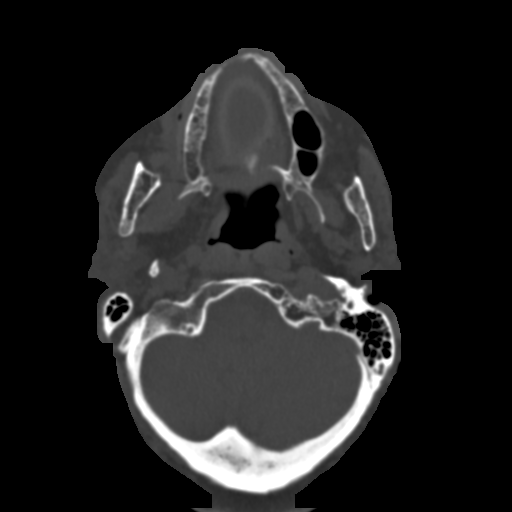
[im 31/111  bone]
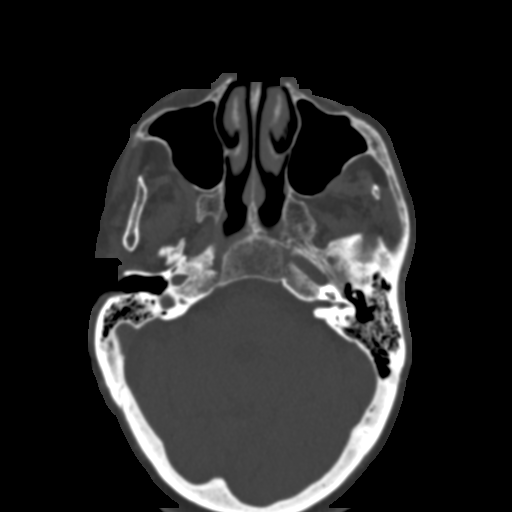
[im 46/111  bone]
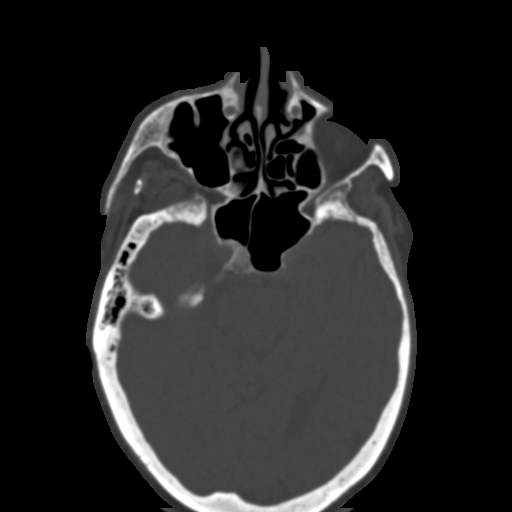
[im 65/111  bone]
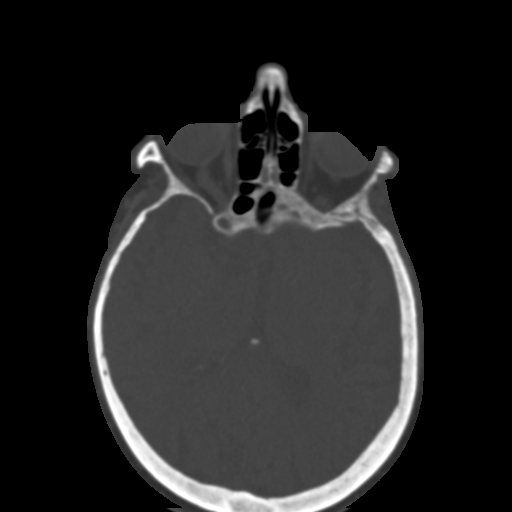
[im 84/111  brain]
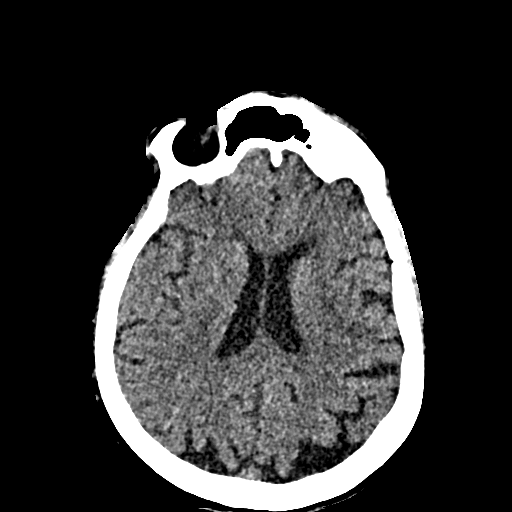
[im 84/111  bone]
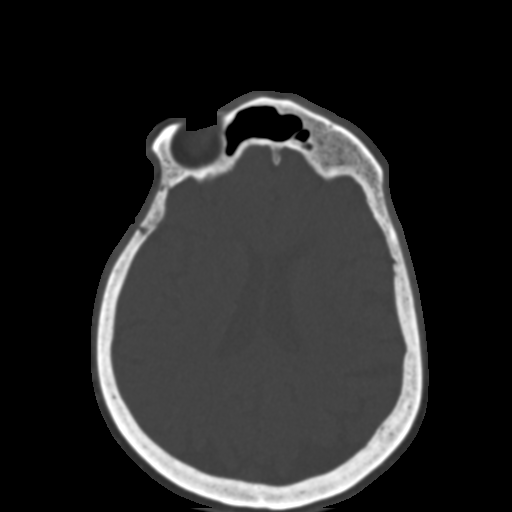
[im 99/111  bone]
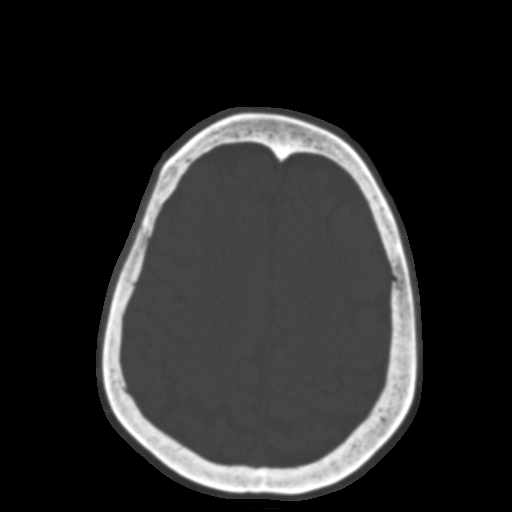

[Series 4: coronal · coronal · 0.22mm/px · 3 of 101 slices shown]
[im 34/101  bone]
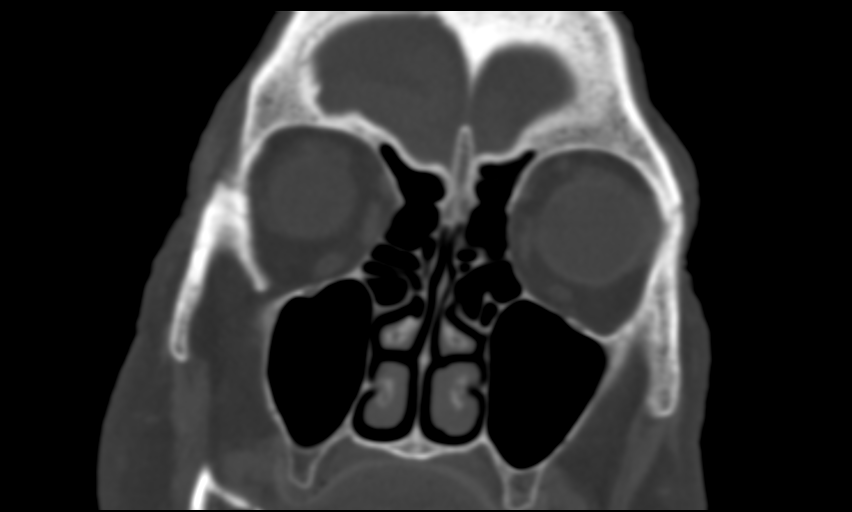
[im 45/101  bone]
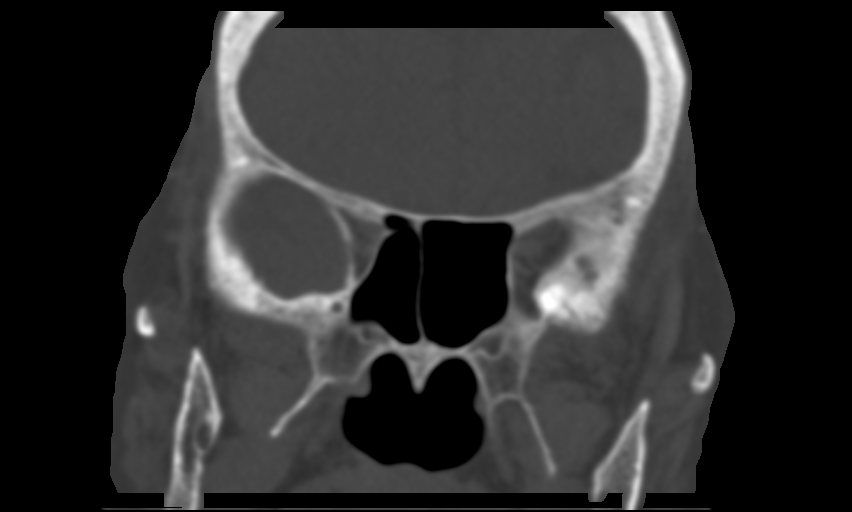
[im 56/101  bone]
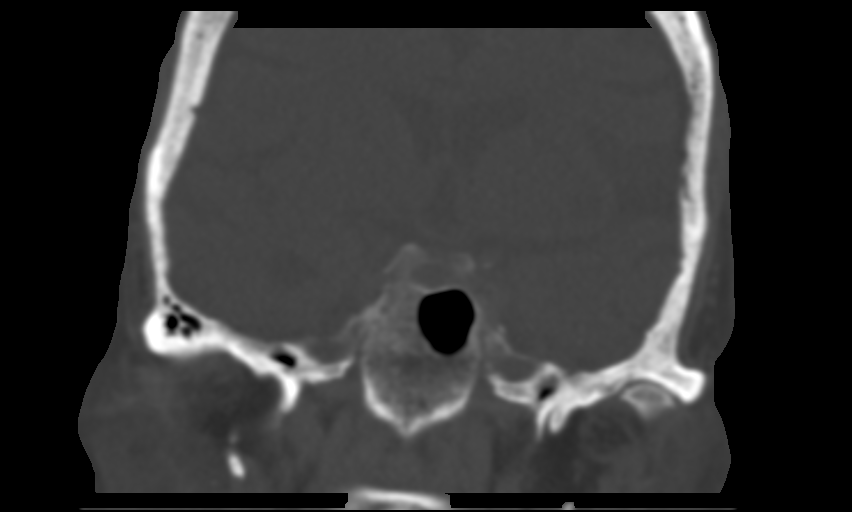

[Series 5: sagittal · sagittal · 0.21mm/px · 3 of 101 slices shown]
[im 34/101  bone]
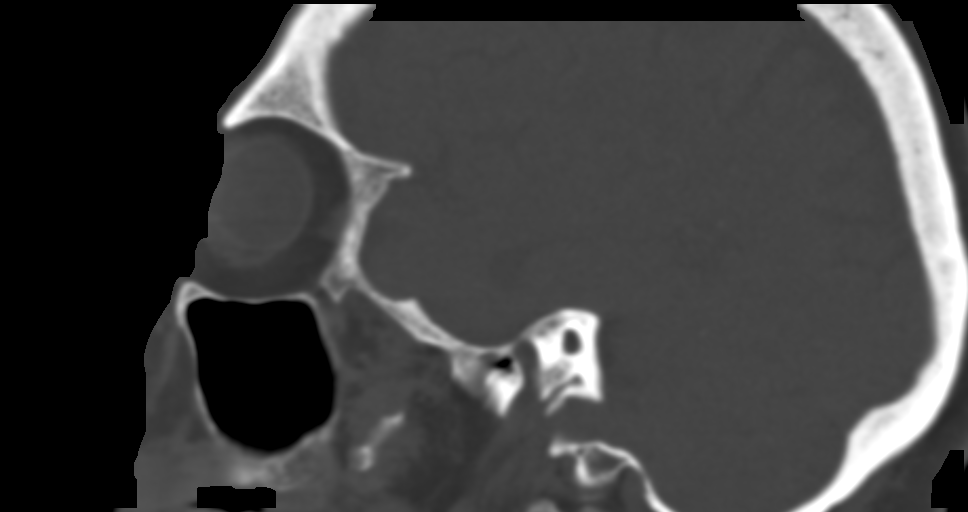
[im 51/101  bone]
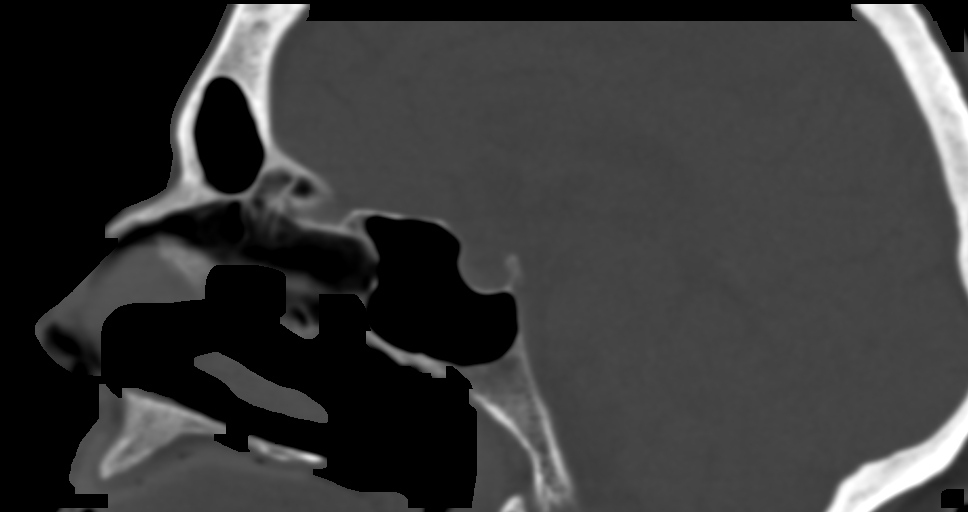
[im 67/101  bone]
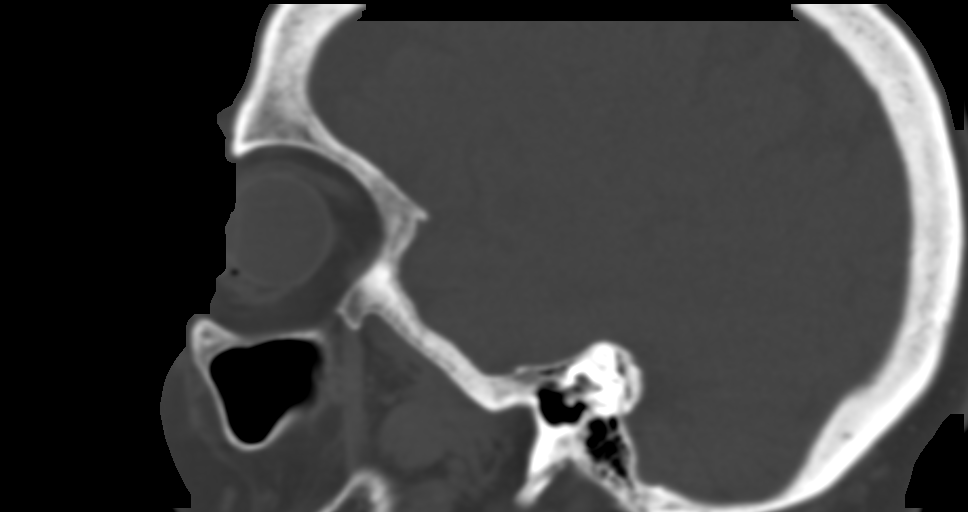

[12 of 47 positions shown; findings below may reference images not displayed]

FINDINGS: Paranasal sinuses:

Frontal: Mildly hyperplastic, more so on the right. Normally
aerated. Patent frontal sinus drainage pathways.

Ethmoid: Normally aerated.

Maxillary: Mildly hyperplastic. Normally aerated.

Sphenoid: Hyperplastic on the left. Normally aerated. Patent
sphenoethmoidal recesses.

Right ostiomeatal unit: Patent (coronal image 30).

Left ostiomeatal unit: Patent (same image).

Nasal passages: Nasal septum is intact and midline. Symmetric but
mild appearing bilateral nasal cavity mucosal thickening (coronal
image 31). Pneumatized olfactory recesses.

Anatomy:

Hyperplastic sphenoid sinuses without clinoid process pneumatization
as seen on coronal image 49. Uncovering of the left vidian canal.

Bilateral anterior ethmoidal artery position suspected on coronal
image 37 with bilateral adjacent frontal sinus pneumatization.

Keros type 3 olfactory fossa (coronal image 39).

Other: Bilateral tympanic cavities and mastoids are clear.

Chronic altered bone marrow signal at the left skull base involving
the left occipital condyle and clivus is unchanged since 2879 and
appears benign but more resembles Paget's disease than fibrous
dysplasia (series 3, image 4).

Elsewhere bone marrow signal is within normal limits. Absent
maxillary dentition. No acute osseous abnormality identified.

Small chronic cerebellar infarcts in each hemisphere appears stable
since 6727. Stable visible noncontrast brain parenchyma. Calcified
atherosclerosis at the skull base.

Stable, negative visible orbit and scalp soft tissues. Negative
visible noncontrast deep soft tissue spaces of the face.
IMPRESSION: 1. Normally aerated paranasal sinuses. Patent sinus drainage
pathways.
2. Symmetric but mild nasal cavity mucosal thickening raising the
possibility of rhinitis.
3. Small chronic cerebellar infarcts are stable since [DATE]. Bone mineralization changes at the left skull base are also
stable and appear benign, favor Paget's disease.

## 2019-10-27 DIAGNOSIS — E782 Mixed hyperlipidemia: Secondary | ICD-10-CM | POA: Diagnosis not present

## 2019-10-27 DIAGNOSIS — K219 Gastro-esophageal reflux disease without esophagitis: Secondary | ICD-10-CM | POA: Diagnosis not present

## 2019-10-27 DIAGNOSIS — R946 Abnormal results of thyroid function studies: Secondary | ICD-10-CM | POA: Diagnosis not present

## 2019-10-27 DIAGNOSIS — E1165 Type 2 diabetes mellitus with hyperglycemia: Secondary | ICD-10-CM | POA: Diagnosis not present

## 2019-10-27 DIAGNOSIS — E1142 Type 2 diabetes mellitus with diabetic polyneuropathy: Secondary | ICD-10-CM | POA: Diagnosis not present

## 2019-10-27 DIAGNOSIS — E1122 Type 2 diabetes mellitus with diabetic chronic kidney disease: Secondary | ICD-10-CM | POA: Diagnosis not present

## 2019-10-27 DIAGNOSIS — N183 Chronic kidney disease, stage 3 unspecified: Secondary | ICD-10-CM | POA: Diagnosis not present

## 2019-10-27 DIAGNOSIS — E039 Hypothyroidism, unspecified: Secondary | ICD-10-CM | POA: Diagnosis not present

## 2019-10-27 DIAGNOSIS — I10 Essential (primary) hypertension: Secondary | ICD-10-CM | POA: Diagnosis not present

## 2019-11-02 DIAGNOSIS — I482 Chronic atrial fibrillation, unspecified: Secondary | ICD-10-CM | POA: Diagnosis not present

## 2019-11-02 DIAGNOSIS — I10 Essential (primary) hypertension: Secondary | ICD-10-CM | POA: Diagnosis not present

## 2019-11-02 DIAGNOSIS — K219 Gastro-esophageal reflux disease without esophagitis: Secondary | ICD-10-CM | POA: Diagnosis not present

## 2019-11-02 DIAGNOSIS — E1122 Type 2 diabetes mellitus with diabetic chronic kidney disease: Secondary | ICD-10-CM | POA: Diagnosis not present

## 2019-11-02 DIAGNOSIS — Z0001 Encounter for general adult medical examination with abnormal findings: Secondary | ICD-10-CM | POA: Diagnosis not present

## 2019-11-02 DIAGNOSIS — M2041 Other hammer toe(s) (acquired), right foot: Secondary | ICD-10-CM | POA: Diagnosis not present

## 2019-11-02 DIAGNOSIS — M1711 Unilateral primary osteoarthritis, right knee: Secondary | ICD-10-CM | POA: Diagnosis not present

## 2019-11-02 DIAGNOSIS — F331 Major depressive disorder, recurrent, moderate: Secondary | ICD-10-CM | POA: Diagnosis not present

## 2019-11-02 DIAGNOSIS — E782 Mixed hyperlipidemia: Secondary | ICD-10-CM | POA: Diagnosis not present

## 2019-11-02 DIAGNOSIS — E1142 Type 2 diabetes mellitus with diabetic polyneuropathy: Secondary | ICD-10-CM | POA: Diagnosis not present

## 2019-11-02 DIAGNOSIS — I5022 Chronic systolic (congestive) heart failure: Secondary | ICD-10-CM | POA: Diagnosis not present

## 2019-11-02 DIAGNOSIS — D6869 Other thrombophilia: Secondary | ICD-10-CM | POA: Diagnosis not present

## 2019-11-02 DIAGNOSIS — Z23 Encounter for immunization: Secondary | ICD-10-CM | POA: Diagnosis not present

## 2019-11-22 DIAGNOSIS — L84 Corns and callosities: Secondary | ICD-10-CM | POA: Diagnosis not present

## 2019-11-22 DIAGNOSIS — E1142 Type 2 diabetes mellitus with diabetic polyneuropathy: Secondary | ICD-10-CM | POA: Diagnosis not present

## 2019-11-22 DIAGNOSIS — M79676 Pain in unspecified toe(s): Secondary | ICD-10-CM | POA: Diagnosis not present

## 2019-11-22 DIAGNOSIS — B351 Tinea unguium: Secondary | ICD-10-CM | POA: Diagnosis not present

## 2020-01-13 DIAGNOSIS — Z993 Dependence on wheelchair: Secondary | ICD-10-CM | POA: Diagnosis not present

## 2020-01-13 DIAGNOSIS — M159 Polyosteoarthritis, unspecified: Secondary | ICD-10-CM | POA: Diagnosis not present

## 2020-01-13 DIAGNOSIS — N183 Chronic kidney disease, stage 3 unspecified: Secondary | ICD-10-CM | POA: Diagnosis not present

## 2020-01-13 DIAGNOSIS — Z6823 Body mass index (BMI) 23.0-23.9, adult: Secondary | ICD-10-CM | POA: Diagnosis not present

## 2020-01-13 DIAGNOSIS — E1122 Type 2 diabetes mellitus with diabetic chronic kidney disease: Secondary | ICD-10-CM | POA: Diagnosis not present

## 2020-01-13 DIAGNOSIS — D692 Other nonthrombocytopenic purpura: Secondary | ICD-10-CM | POA: Diagnosis not present

## 2020-01-17 ENCOUNTER — Encounter: Payer: Self-pay | Admitting: Cardiology

## 2020-01-17 ENCOUNTER — Ambulatory Visit: Payer: Medicare HMO | Admitting: Cardiology

## 2020-01-17 VITALS — BP 104/54 | HR 69 | Ht 64.0 in | Wt 135.0 lb

## 2020-01-17 DIAGNOSIS — I4821 Permanent atrial fibrillation: Secondary | ICD-10-CM

## 2020-01-17 DIAGNOSIS — I428 Other cardiomyopathies: Secondary | ICD-10-CM | POA: Diagnosis not present

## 2020-01-17 NOTE — Patient Instructions (Addendum)

## 2020-01-17 NOTE — Progress Notes (Signed)
Cardiology Office Note  Date: 01/17/2020   ID: Aimee Miles, DOB December 03, 1930, MRN [id number]  PCP:  Richardean Chimera, MD  Cardiologist:  Nona Dell, MD Electrophysiologist:  None   Chief Complaint  Patient presents with  . Cardiac follow-up    History of Present Illness: Aimee Miles is an 86 y.o. female last seen in May.  She is here today for a follow-up visit.  Since last encounter she does not describe any major change in health.  She and her family are preparing to get together for Thanksgiving.  She does not report any sense of palpitations, no increasing shortness of breath with typical ADLs.  She has had no dizziness or syncope.  I reviewed her lab work from May as noted below.  She remains on Eliquis, reports no spontaneous bleeding problems.  I personally reviewed her ECG today which shows rate controlled atrial fibrillation with IVCD.  Past Medical History:  Diagnosis Date  . Atrial fibrillation ([facility])   . Chronic systolic (congestive) heart failure ([facility])   . Hepatitis   . History of pulmonary embolus (PE)    Bilateral PE January 2015 on Coumadin until June 2015  . Hypothyroidism   . LBBB (left bundle branch block)     Past Surgical History:  Procedure Laterality Date  . CHOLECYSTECTOMY    . EYE SURGERY    . JOINT REPLACEMENT      Current Outpatient Medications  Medication Sig Dispense Refill  . acetaminophen (TYLENOL 8 HOUR ARTHRITIS PAIN) 650 MG CR tablet Take 1,300 mg by mouth 2 (two) times daily as needed for pain.    Marland Kitchen acetaminophen (TYLENOL) 325 MG tablet Take 325 mg by mouth as needed.    Marland Kitchen apixaban (ELIQUIS) 2.5 MG TABS tablet Take 2.5 mg by mouth 2 (two) times daily.    Marland Kitchen atorvastatin (LIPITOR) 10 MG tablet Take 1 tablet by mouth daily.    . carvedilol (COREG) 12.5 MG tablet Take 12.5 mg by mouth 2 (two) times daily with a meal.    . clobetasol ointment (TEMOVATE) 0.05 % Apply 1 application topically 2 (two) times daily as needed.    .  folic acid (FOLVITE) 1 MG tablet Take 1 mg by mouth daily. Reported on 09/14/2015    . furosemide (LASIX) 40 MG tablet Take 40 mg by mouth daily.    Marland Kitchen glipiZIDE (GLUCOTROL XL) 2.5 MG 24 hr tablet Take 1 tablet by mouth daily.    . isosorbide mononitrate (IMDUR) 30 MG 24 hr tablet Take 30 mg by mouth daily.    Marland Kitchen levothyroxine (SYNTHROID, LEVOTHROID) 100 MCG tablet Take 100 mcg by mouth daily.     Marland Kitchen losartan (COZAAR) 25 MG tablet Take 0.5 tablets (12.5 mg total) by mouth daily. 45 tablet 2  . methocarbamol (ROBAXIN) 500 MG tablet Take 500-1,000 mg by mouth 3 (three) times daily.    . Multiple Vitamins-Minerals (MULTIVITAMIN ADULTS PO) Take 1 tablet by mouth 2 (two) times daily. Solutions Rx Superior Women's Multivitamin    . omeprazole (PRILOSEC) 20 MG capsule Take 20 mg by mouth daily.    . potassium chloride (K-DUR) 10 MEQ tablet Take 10 mEq by mouth daily.    Marland Kitchen venlafaxine (EFFEXOR) 75 MG tablet Take 75 mg by mouth 2 (two) times daily with a meal.     No current facility-administered medications for this visit.   Allergies:  Codeine, Penicillins, and Sulfa antibiotics   ROS: Chronic arthritic pains.  Insomnia.  Physical  Exam: VS:  BP (!) 104/54   Pulse 69   Ht 5\' 4"  (1.626 m)   Wt 135 lb (61.2 kg)   SpO2 94%   BMI 23.17 kg/m , BMI Body mass index is 23.17 kg/m.  Wt Readings from Last 3 Encounters:  01/17/20 135 lb (61.2 kg)  07/11/19 136 lb (61.7 kg)  10/26/18 145 lb 6.4 oz (66 kg)    General: Elderly woman, seated in wheelchair. HEENT: Conjunctiva and lids normal, wearing a mask. Neck: Supple, no elevated JVP or carotid bruits, no thyromegaly. Lungs: Clear to auscultation, nonlabored breathing at rest. Cardiac: Irregularly irregular, no S3, soft systolic murmur. Extremities: Venous stasis.  ECG:  An ECG dated 10/26/2018 was personally reviewed today and demonstrated:  Atrial fibrillation with level branch block.  Recent Labwork:  May 2021: TSH 5.21, BUN 25, creatinine  1.41, potassium 3.9, GFR 34, AST 21, ALT 14, hemoglobin 10.8, platelets 131, cholesterol 122, triglycerides 102, HDL 46, LDL 57, hemoglobin A1c 7.3%  Other Studies Reviewed Today:  Echocardiogram7/27/2017: Study Conclusions  - Left ventricle: The cavity size was normal. Wall thickness was normal. Systolic function was moderately reduced. The estimated ejection fraction was in the range of 35% to 40%. Diffuse hypokinesis. The study is not technically sufficient to allow evaluation of LV diastolic function. - Ventricular septum: Septal motion showed paradox. - Aortic valve: Calcified annulus. Trileaflet. - Mitral valve: There was trivial regurgitation. - Left atrium: The atrium was moderately dilated. - Right atrium: Central venous pressure (est): 3 mm Hg. - Atrial septum: The septum bowed from left to right, consistent with increased left atrial pressure. No defect or patent foramen ovale was identified. - Tricuspid valve: There was moderate regurgitation. - Pulmonary arteries: PA peak pressure: 35 mm Hg (S). - Pericardium, extracardiac: There was no pericardial effusion.  Impressions:  - Normal LV wall thickness with LVEF 35-40%. There is diffuse hypokinesis with paradoxical septal motion consistent with left bundle branch block. Indeterminate diastolic function in the setting of atrial fibrillation. Moderate left atrial enlargement with evidence of increased left atrial pressure. Trivial mitral regurgitation. Mildly sclerotic aortic valve. Moderate tricuspid regurgitation with PASP 35 mmHg.  Assessment and Plan:  1.  Permanent atrial fibrillation.  CHA2DS2-VASc score is 4.  She remains on Eliquis for stroke prophylaxis and has had good heart rate control on Coreg.  No changes to current regimen.  2.  Nonischemic cardiomyopathy, managed conservatively with LVEF 35 to 40% by last assessment.  No evidence of fluid overload.  Able compared to May.   Continue Coreg, losartan, Lasix, and potassium supplement.  Medication Adjustments/Labs and Tests Ordered: Current medicines are reviewed at length with the patient today.  Concerns regarding medicines are outlined above.   Tests Ordered: Orders Placed This Encounter  Procedures  . EKG 12-Lead    Medication Changes: No orders of the defined types were placed in this encounter.   Disposition:  Follow up 6 months in the [place] office.  Signed, [place], MD, [facility] 01/17/2020 2:12 PM    [facility] at [facility] [address] [place], [place], [place] Phone: [phone]; Fax: [phone]

## 2020-02-28 DIAGNOSIS — B351 Tinea unguium: Secondary | ICD-10-CM | POA: Diagnosis not present

## 2020-02-28 DIAGNOSIS — L84 Corns and callosities: Secondary | ICD-10-CM | POA: Diagnosis not present

## 2020-02-28 DIAGNOSIS — E1142 Type 2 diabetes mellitus with diabetic polyneuropathy: Secondary | ICD-10-CM | POA: Diagnosis not present

## 2020-02-28 DIAGNOSIS — M79676 Pain in unspecified toe(s): Secondary | ICD-10-CM | POA: Diagnosis not present

## 2020-03-01 DIAGNOSIS — E1122 Type 2 diabetes mellitus with diabetic chronic kidney disease: Secondary | ICD-10-CM | POA: Diagnosis not present

## 2020-03-01 DIAGNOSIS — I5022 Chronic systolic (congestive) heart failure: Secondary | ICD-10-CM | POA: Diagnosis not present

## 2020-03-01 DIAGNOSIS — E1165 Type 2 diabetes mellitus with hyperglycemia: Secondary | ICD-10-CM | POA: Diagnosis not present

## 2020-03-01 DIAGNOSIS — N183 Chronic kidney disease, stage 3 unspecified: Secondary | ICD-10-CM | POA: Diagnosis not present

## 2020-03-01 DIAGNOSIS — E1142 Type 2 diabetes mellitus with diabetic polyneuropathy: Secondary | ICD-10-CM | POA: Diagnosis not present

## 2020-03-01 DIAGNOSIS — E559 Vitamin D deficiency, unspecified: Secondary | ICD-10-CM | POA: Diagnosis not present

## 2020-03-01 DIAGNOSIS — R946 Abnormal results of thyroid function studies: Secondary | ICD-10-CM | POA: Diagnosis not present

## 2020-03-01 DIAGNOSIS — E782 Mixed hyperlipidemia: Secondary | ICD-10-CM | POA: Diagnosis not present

## 2020-03-06 DIAGNOSIS — E1122 Type 2 diabetes mellitus with diabetic chronic kidney disease: Secondary | ICD-10-CM | POA: Diagnosis not present

## 2020-03-06 DIAGNOSIS — M2041 Other hammer toe(s) (acquired), right foot: Secondary | ICD-10-CM | POA: Diagnosis not present

## 2020-03-06 DIAGNOSIS — Z23 Encounter for immunization: Secondary | ICD-10-CM | POA: Diagnosis not present

## 2020-03-06 DIAGNOSIS — I10 Essential (primary) hypertension: Secondary | ICD-10-CM | POA: Diagnosis not present

## 2020-03-06 DIAGNOSIS — E1142 Type 2 diabetes mellitus with diabetic polyneuropathy: Secondary | ICD-10-CM | POA: Diagnosis not present

## 2020-03-06 DIAGNOSIS — M1711 Unilateral primary osteoarthritis, right knee: Secondary | ICD-10-CM | POA: Diagnosis not present

## 2020-03-06 DIAGNOSIS — K439 Ventral hernia without obstruction or gangrene: Secondary | ICD-10-CM | POA: Diagnosis not present

## 2020-04-19 DIAGNOSIS — M12811 Other specific arthropathies, not elsewhere classified, right shoulder: Secondary | ICD-10-CM | POA: Diagnosis not present

## 2020-04-19 DIAGNOSIS — M19012 Primary osteoarthritis, left shoulder: Secondary | ICD-10-CM | POA: Diagnosis not present

## 2020-04-19 DIAGNOSIS — M12812 Other specific arthropathies, not elsewhere classified, left shoulder: Secondary | ICD-10-CM | POA: Diagnosis not present

## 2020-05-02 DIAGNOSIS — M21061 Valgus deformity, not elsewhere classified, right knee: Secondary | ICD-10-CM | POA: Diagnosis not present

## 2020-05-02 DIAGNOSIS — M21062 Valgus deformity, not elsewhere classified, left knee: Secondary | ICD-10-CM | POA: Diagnosis not present

## 2020-05-02 DIAGNOSIS — M1712 Unilateral primary osteoarthritis, left knee: Secondary | ICD-10-CM | POA: Diagnosis not present

## 2020-05-09 DIAGNOSIS — F330 Major depressive disorder, recurrent, mild: Secondary | ICD-10-CM | POA: Diagnosis not present

## 2020-05-09 DIAGNOSIS — E782 Mixed hyperlipidemia: Secondary | ICD-10-CM | POA: Diagnosis not present

## 2020-05-09 DIAGNOSIS — I130 Hypertensive heart and chronic kidney disease with heart failure and stage 1 through stage 4 chronic kidney disease, or unspecified chronic kidney disease: Secondary | ICD-10-CM | POA: Diagnosis not present

## 2020-05-09 DIAGNOSIS — K219 Gastro-esophageal reflux disease without esophagitis: Secondary | ICD-10-CM | POA: Diagnosis not present

## 2020-05-09 DIAGNOSIS — I4891 Unspecified atrial fibrillation: Secondary | ICD-10-CM | POA: Diagnosis not present

## 2020-05-09 DIAGNOSIS — D692 Other nonthrombocytopenic purpura: Secondary | ICD-10-CM | POA: Diagnosis not present

## 2020-05-09 DIAGNOSIS — N183 Chronic kidney disease, stage 3 unspecified: Secondary | ICD-10-CM | POA: Diagnosis not present

## 2020-05-09 DIAGNOSIS — E1122 Type 2 diabetes mellitus with diabetic chronic kidney disease: Secondary | ICD-10-CM | POA: Diagnosis not present

## 2020-05-09 DIAGNOSIS — E039 Hypothyroidism, unspecified: Secondary | ICD-10-CM | POA: Diagnosis not present

## 2020-05-09 DIAGNOSIS — E440 Moderate protein-calorie malnutrition: Secondary | ICD-10-CM | POA: Diagnosis not present

## 2020-05-09 DIAGNOSIS — I5022 Chronic systolic (congestive) heart failure: Secondary | ICD-10-CM | POA: Diagnosis not present

## 2020-05-09 DIAGNOSIS — I361 Nonrheumatic tricuspid (valve) insufficiency: Secondary | ICD-10-CM | POA: Diagnosis not present

## 2020-05-09 DIAGNOSIS — E261 Secondary hyperaldosteronism: Secondary | ICD-10-CM | POA: Diagnosis not present

## 2020-05-29 DIAGNOSIS — E1142 Type 2 diabetes mellitus with diabetic polyneuropathy: Secondary | ICD-10-CM | POA: Diagnosis not present

## 2020-05-29 DIAGNOSIS — M79676 Pain in unspecified toe(s): Secondary | ICD-10-CM | POA: Diagnosis not present

## 2020-05-29 DIAGNOSIS — B351 Tinea unguium: Secondary | ICD-10-CM | POA: Diagnosis not present

## 2020-05-29 DIAGNOSIS — L84 Corns and callosities: Secondary | ICD-10-CM | POA: Diagnosis not present

## 2020-05-30 DIAGNOSIS — I10 Essential (primary) hypertension: Secondary | ICD-10-CM | POA: Diagnosis not present

## 2020-05-30 DIAGNOSIS — F331 Major depressive disorder, recurrent, moderate: Secondary | ICD-10-CM | POA: Diagnosis not present

## 2020-05-30 DIAGNOSIS — I5022 Chronic systolic (congestive) heart failure: Secondary | ICD-10-CM | POA: Diagnosis not present

## 2020-05-30 DIAGNOSIS — I482 Chronic atrial fibrillation, unspecified: Secondary | ICD-10-CM | POA: Diagnosis not present

## 2020-05-30 DIAGNOSIS — E782 Mixed hyperlipidemia: Secondary | ICD-10-CM | POA: Diagnosis not present

## 2020-05-30 DIAGNOSIS — E1122 Type 2 diabetes mellitus with diabetic chronic kidney disease: Secondary | ICD-10-CM | POA: Diagnosis not present

## 2020-05-30 DIAGNOSIS — E1165 Type 2 diabetes mellitus with hyperglycemia: Secondary | ICD-10-CM | POA: Diagnosis not present

## 2020-06-25 DIAGNOSIS — E119 Type 2 diabetes mellitus without complications: Secondary | ICD-10-CM | POA: Diagnosis not present

## 2020-06-25 DIAGNOSIS — M25512 Pain in left shoulder: Secondary | ICD-10-CM | POA: Diagnosis not present

## 2020-06-25 DIAGNOSIS — H00011 Hordeolum externum right upper eyelid: Secondary | ICD-10-CM | POA: Diagnosis not present

## 2020-06-29 DIAGNOSIS — E1122 Type 2 diabetes mellitus with diabetic chronic kidney disease: Secondary | ICD-10-CM | POA: Diagnosis not present

## 2020-06-29 DIAGNOSIS — N1832 Chronic kidney disease, stage 3b: Secondary | ICD-10-CM | POA: Diagnosis not present

## 2020-06-29 DIAGNOSIS — K219 Gastro-esophageal reflux disease without esophagitis: Secondary | ICD-10-CM | POA: Diagnosis not present

## 2020-06-29 DIAGNOSIS — I10 Essential (primary) hypertension: Secondary | ICD-10-CM | POA: Diagnosis not present

## 2020-06-29 DIAGNOSIS — E039 Hypothyroidism, unspecified: Secondary | ICD-10-CM | POA: Diagnosis not present

## 2020-06-30 DIAGNOSIS — E1122 Type 2 diabetes mellitus with diabetic chronic kidney disease: Secondary | ICD-10-CM | POA: Diagnosis not present

## 2020-06-30 DIAGNOSIS — I10 Essential (primary) hypertension: Secondary | ICD-10-CM | POA: Diagnosis not present

## 2020-06-30 DIAGNOSIS — I482 Chronic atrial fibrillation, unspecified: Secondary | ICD-10-CM | POA: Diagnosis not present

## 2020-06-30 DIAGNOSIS — I5022 Chronic systolic (congestive) heart failure: Secondary | ICD-10-CM | POA: Diagnosis not present

## 2020-06-30 DIAGNOSIS — E782 Mixed hyperlipidemia: Secondary | ICD-10-CM | POA: Diagnosis not present

## 2020-07-04 DIAGNOSIS — I10 Essential (primary) hypertension: Secondary | ICD-10-CM | POA: Diagnosis not present

## 2020-07-04 DIAGNOSIS — M1711 Unilateral primary osteoarthritis, right knee: Secondary | ICD-10-CM | POA: Diagnosis not present

## 2020-07-04 DIAGNOSIS — E7849 Other hyperlipidemia: Secondary | ICD-10-CM | POA: Diagnosis not present

## 2020-07-04 DIAGNOSIS — I482 Chronic atrial fibrillation, unspecified: Secondary | ICD-10-CM | POA: Diagnosis not present

## 2020-07-04 DIAGNOSIS — I5022 Chronic systolic (congestive) heart failure: Secondary | ICD-10-CM | POA: Diagnosis not present

## 2020-07-04 DIAGNOSIS — F331 Major depressive disorder, recurrent, moderate: Secondary | ICD-10-CM | POA: Diagnosis not present

## 2020-07-04 DIAGNOSIS — Z23 Encounter for immunization: Secondary | ICD-10-CM | POA: Diagnosis not present

## 2020-07-04 DIAGNOSIS — K439 Ventral hernia without obstruction or gangrene: Secondary | ICD-10-CM | POA: Diagnosis not present

## 2020-07-09 DIAGNOSIS — M25512 Pain in left shoulder: Secondary | ICD-10-CM | POA: Diagnosis not present

## 2020-07-16 NOTE — Progress Notes (Signed)
Cardiology Office Note  Date: 07/17/2020   ID: Aimee Miles, DOB 1930/11/04, MRN [id number]  PCP:  Aimee Chimera, MD  Cardiologist:  Aimee Dell, MD Electrophysiologist:  None   Chief Complaint  Patient presents with  . Cardiac follow-up    History of Present Illness: Aimee Miles is an 87 y.o. female last seen in November 2021.  She is here today with family member for a follow-up visit.  From a cardiac perspective she continues to do well.  She does not describe any sense of palpitations, no angina symptoms.  No shortness of breath with low-level activity and basic ADLs.  She is in a wheelchair today.  I reviewed her medications.  She does not report any spontaneous bleeding problems on Eliquis.  We are requesting her recent lab work from Dr. Reuel Boom.   Past Medical History:  Diagnosis Date  . Atrial fibrillation ([facility])   . Chronic systolic (congestive) heart failure ([facility])   . Hepatitis   . History of pulmonary embolus (PE)    Bilateral PE January 2015 on Coumadin until June 2015  . Hypothyroidism   . LBBB (left bundle branch block)     Past Surgical History:  Procedure Laterality Date  . CHOLECYSTECTOMY    . EYE SURGERY    . JOINT REPLACEMENT      Current Outpatient Medications  Medication Sig Dispense Refill  . acetaminophen (TYLENOL) 325 MG tablet Take 325 mg by mouth as needed.    Marland Kitchen acetaminophen (TYLENOL) 650 MG CR tablet Take 1,300 mg by mouth 2 (two) times daily as needed for pain.    Marland Kitchen apixaban (ELIQUIS) 2.5 MG TABS tablet Take 2.5 mg by mouth 2 (two) times daily.    Marland Kitchen atorvastatin (LIPITOR) 10 MG tablet Take 1 tablet by mouth daily.    . carvedilol (COREG) 12.5 MG tablet Take 12.5 mg by mouth 2 (two) times daily with a meal.    . clobetasol ointment (TEMOVATE) 0.05 % Apply 1 application topically 2 (two) times daily as needed.    . folic acid (FOLVITE) 1 MG tablet Take 1 mg by mouth daily. Reported on 09/14/2015    . furosemide (LASIX) 40 MG tablet  Take 40 mg by mouth daily.    Marland Kitchen glipiZIDE (GLUCOTROL XL) 2.5 MG 24 hr tablet Take 1 tablet by mouth daily.    . isosorbide mononitrate (IMDUR) 30 MG 24 hr tablet Take 30 mg by mouth daily.    Marland Kitchen levothyroxine (SYNTHROID) 125 MCG tablet Take 125 mcg by mouth daily before breakfast.    . losartan (COZAAR) 25 MG tablet Take 0.5 tablets (12.5 mg total) by mouth daily. 45 tablet 2  . methocarbamol (ROBAXIN) 500 MG tablet Take 500-1,000 mg by mouth 3 (three) times daily.    . Multiple Vitamins-Minerals (MULTIVITAMIN ADULTS PO) Take 1 tablet by mouth 2 (two) times daily. Solutions Rx Superior Women's Multivitamin    . omeprazole (PRILOSEC) 20 MG capsule Take 20 mg by mouth daily.    . potassium chloride (K-DUR) 10 MEQ tablet Take 10 mEq by mouth daily.     No current facility-administered medications for this visit.   Allergies:  Codeine, Penicillins, and Sulfa antibiotics   ROS: Occasional leg cramps.  No orthopnea or PND.  No syncope.  Physical Exam: VS:  BP 112/62   Pulse 62   Ht 5\' 5"  (1.651 m)   Wt 125 lb (56.7 kg)   SpO2 96%   BMI 20.80 kg/m ,  BMI Body mass index is 20.8 kg/m.  Wt Readings from Last 3 Encounters:  07/17/20 125 lb (56.7 kg)  01/17/20 135 lb (61.2 kg)  07/11/19 136 lb (61.7 kg)    General: Patient appears comfortable at rest.  In wheelchair. HEENT: Conjunctiva and lids normal, wearing a mask. Neck: Supple, no elevated JVP or carotid bruits, no thyromegaly. Lungs: Clear to auscultation, nonlabored breathing at rest. Cardiac: Irregularly irregular, no S3, 1/6 systolic murmur. Extremities: No pitting edema.  ECG:  An ECG dated 01/17/2020 was personally reviewed today and demonstrated:  Rate controlled atrial fibrillation with IVCD.  Recent Labwork:  May 2021: TSH 5.21, BUN 25, creatinine 1.41, potassium 3.9, GFR 34, AST 21, ALT 14, hemoglobin 10.8, platelets 131, cholesterol 122, triglycerides 102, HDL 46, LDL 57, hemoglobin A1c 7.3%  Other Studies Reviewed  Today:  Echocardiogram7/27/2017: Study Conclusions  - Left ventricle: The cavity size was normal. Wall thickness was normal. Systolic function was moderately reduced. The estimated ejection fraction was in the range of 35% to 40%. Diffuse hypokinesis. The study is not technically sufficient to allow evaluation of LV diastolic function. - Ventricular septum: Septal motion showed paradox. - Aortic valve: Calcified annulus. Trileaflet. - Mitral valve: There was trivial regurgitation. - Left atrium: The atrium was moderately dilated. - Right atrium: Central venous pressure (est): 3 mm Hg. - Atrial septum: The septum bowed from left to right, consistent with increased left atrial pressure. No defect or patent foramen ovale was identified. - Tricuspid valve: There was moderate regurgitation. - Pulmonary arteries: PA peak pressure: 35 mm Hg (S). - Pericardium, extracardiac: There was no pericardial effusion.  Impressions:  - Normal LV wall thickness with LVEF 35-40%. There is diffuse hypokinesis with paradoxical septal motion consistent with left bundle branch block. Indeterminate diastolic function in the setting of atrial fibrillation. Moderate left atrial enlargement with evidence of increased left atrial pressure. Trivial mitral regurgitation. Mildly sclerotic aortic valve. Moderate tricuspid regurgitation with PASP 35 mmHg.  Assessment and Plan:  1.  Permanent atrial fibrillation with CHA2DS2-VASc score of 4.  She reports no palpitations and continues on Coreg for heart rate control as well as Eliquis for stroke prophylaxis.  Requesting recent lab work from Dr. Reuel Boom for review.  2.  Acquired thrombophilia, on Eliquis for stroke prophylaxis.  No reported spontaneous bleeding problems.  3.  Nonischemic cardiomyopathy, managed conservatively without follow-up imaging.  Last LVEF was 35 to 40%.  She reports NYHA class II dyspnea with low-level activity,  no fluid gain.  Continue Coreg, losartan, and Lasix with potassium supplement.  Medication Adjustments/Labs and Tests Ordered: Current medicines are reviewed at length with the patient today.  Concerns regarding medicines are outlined above.   Tests Ordered: No orders of the defined types were placed in this encounter.   Medication Changes: No orders of the defined types were placed in this encounter.   Disposition:  Follow up 6 months.  Signed, Jonelle Sidle, MD, [facility] 07/17/2020 11:38 AM    [facility] at [facility] [address] [place], [place], [place] [address] Phone: [phone]; Fax: [phone]

## 2020-07-17 ENCOUNTER — Ambulatory Visit: Payer: Medicare HMO | Admitting: Cardiology

## 2020-07-17 ENCOUNTER — Encounter: Payer: Self-pay | Admitting: Cardiology

## 2020-07-17 VITALS — BP 112/62 | HR 62 | Ht 65.0 in | Wt 125.0 lb

## 2020-07-17 DIAGNOSIS — I428 Other cardiomyopathies: Secondary | ICD-10-CM | POA: Diagnosis not present

## 2020-07-17 DIAGNOSIS — I4821 Permanent atrial fibrillation: Secondary | ICD-10-CM | POA: Diagnosis not present

## 2020-07-17 NOTE — Patient Instructions (Addendum)
Medication Instructions:   Your physician recommends that you continue on your current medications as directed. Please refer to the Current Medication list given to you today.  Labwork:  none  Testing/Procedures:  none  Follow-Up:  Your physician recommends that you schedule a follow-up appointment in: 6 months. You will receive a reminder call or letter in the mail in about 4 months reminding you to call and schedule your appointment. If you don't receive this letter, please contact our office.  Any Other Special Instructions Will Be Listed Below (If Applicable).  If you need a refill on your cardiac medications before your next appointment, please call your pharmacy. 

## 2020-07-30 DIAGNOSIS — N183 Chronic kidney disease, stage 3 unspecified: Secondary | ICD-10-CM | POA: Diagnosis not present

## 2020-07-30 DIAGNOSIS — E1165 Type 2 diabetes mellitus with hyperglycemia: Secondary | ICD-10-CM | POA: Diagnosis not present

## 2020-07-30 DIAGNOSIS — E782 Mixed hyperlipidemia: Secondary | ICD-10-CM | POA: Diagnosis not present

## 2020-07-30 DIAGNOSIS — I10 Essential (primary) hypertension: Secondary | ICD-10-CM | POA: Diagnosis not present

## 2020-08-02 DIAGNOSIS — R5383 Other fatigue: Secondary | ICD-10-CM | POA: Diagnosis not present

## 2020-08-02 DIAGNOSIS — R946 Abnormal results of thyroid function studies: Secondary | ICD-10-CM | POA: Diagnosis not present

## 2020-08-02 DIAGNOSIS — E7849 Other hyperlipidemia: Secondary | ICD-10-CM | POA: Diagnosis not present

## 2020-08-02 DIAGNOSIS — N1832 Chronic kidney disease, stage 3b: Secondary | ICD-10-CM | POA: Diagnosis not present

## 2020-08-02 DIAGNOSIS — E1122 Type 2 diabetes mellitus with diabetic chronic kidney disease: Secondary | ICD-10-CM | POA: Diagnosis not present

## 2020-08-02 DIAGNOSIS — E782 Mixed hyperlipidemia: Secondary | ICD-10-CM | POA: Diagnosis not present

## 2020-08-06 DIAGNOSIS — F330 Major depressive disorder, recurrent, mild: Secondary | ICD-10-CM | POA: Diagnosis not present

## 2020-08-06 DIAGNOSIS — M159 Polyosteoarthritis, unspecified: Secondary | ICD-10-CM | POA: Diagnosis not present

## 2020-08-06 DIAGNOSIS — E440 Moderate protein-calorie malnutrition: Secondary | ICD-10-CM | POA: Diagnosis not present

## 2020-08-06 DIAGNOSIS — N183 Chronic kidney disease, stage 3 unspecified: Secondary | ICD-10-CM | POA: Diagnosis not present

## 2020-08-06 DIAGNOSIS — N2581 Secondary hyperparathyroidism of renal origin: Secondary | ICD-10-CM | POA: Diagnosis not present

## 2020-08-08 DIAGNOSIS — E1142 Type 2 diabetes mellitus with diabetic polyneuropathy: Secondary | ICD-10-CM | POA: Diagnosis not present

## 2020-08-08 DIAGNOSIS — E1122 Type 2 diabetes mellitus with diabetic chronic kidney disease: Secondary | ICD-10-CM | POA: Diagnosis not present

## 2020-08-08 DIAGNOSIS — I25111 Atherosclerotic heart disease of native coronary artery with angina pectoris with documented spasm: Secondary | ICD-10-CM | POA: Diagnosis not present

## 2020-08-08 DIAGNOSIS — I5022 Chronic systolic (congestive) heart failure: Secondary | ICD-10-CM | POA: Diagnosis not present

## 2020-08-08 DIAGNOSIS — I10 Essential (primary) hypertension: Secondary | ICD-10-CM | POA: Diagnosis not present

## 2020-08-08 DIAGNOSIS — I482 Chronic atrial fibrillation, unspecified: Secondary | ICD-10-CM | POA: Diagnosis not present

## 2020-08-08 DIAGNOSIS — Z0001 Encounter for general adult medical examination with abnormal findings: Secondary | ICD-10-CM | POA: Diagnosis not present

## 2020-08-08 DIAGNOSIS — K439 Ventral hernia without obstruction or gangrene: Secondary | ICD-10-CM | POA: Diagnosis not present

## 2020-08-29 DIAGNOSIS — M545 Low back pain, unspecified: Secondary | ICD-10-CM | POA: Diagnosis not present

## 2020-08-30 DIAGNOSIS — N183 Chronic kidney disease, stage 3 unspecified: Secondary | ICD-10-CM | POA: Diagnosis not present

## 2020-08-30 DIAGNOSIS — E1165 Type 2 diabetes mellitus with hyperglycemia: Secondary | ICD-10-CM | POA: Diagnosis not present

## 2020-08-30 DIAGNOSIS — I10 Essential (primary) hypertension: Secondary | ICD-10-CM | POA: Diagnosis not present

## 2020-08-30 DIAGNOSIS — I714 Abdominal aortic aneurysm, without rupture: Secondary | ICD-10-CM | POA: Diagnosis not present

## 2020-08-30 DIAGNOSIS — E782 Mixed hyperlipidemia: Secondary | ICD-10-CM | POA: Diagnosis not present

## 2020-08-30 DIAGNOSIS — Z87891 Personal history of nicotine dependence: Secondary | ICD-10-CM | POA: Diagnosis not present

## 2020-08-30 DIAGNOSIS — M1611 Unilateral primary osteoarthritis, right hip: Secondary | ICD-10-CM | POA: Diagnosis not present

## 2020-09-14 DIAGNOSIS — H00011 Hordeolum externum right upper eyelid: Secondary | ICD-10-CM | POA: Diagnosis not present

## 2020-09-20 DIAGNOSIS — B351 Tinea unguium: Secondary | ICD-10-CM | POA: Diagnosis not present

## 2020-09-20 DIAGNOSIS — M79676 Pain in unspecified toe(s): Secondary | ICD-10-CM | POA: Diagnosis not present

## 2020-09-20 DIAGNOSIS — E1142 Type 2 diabetes mellitus with diabetic polyneuropathy: Secondary | ICD-10-CM | POA: Diagnosis not present

## 2020-09-20 DIAGNOSIS — L84 Corns and callosities: Secondary | ICD-10-CM | POA: Diagnosis not present

## 2020-09-30 DIAGNOSIS — N183 Chronic kidney disease, stage 3 unspecified: Secondary | ICD-10-CM | POA: Diagnosis not present

## 2020-09-30 DIAGNOSIS — I10 Essential (primary) hypertension: Secondary | ICD-10-CM | POA: Diagnosis not present

## 2020-09-30 DIAGNOSIS — E782 Mixed hyperlipidemia: Secondary | ICD-10-CM | POA: Diagnosis not present

## 2020-09-30 DIAGNOSIS — E1165 Type 2 diabetes mellitus with hyperglycemia: Secondary | ICD-10-CM | POA: Diagnosis not present

## 2020-10-10 DIAGNOSIS — E11319 Type 2 diabetes mellitus with unspecified diabetic retinopathy without macular edema: Secondary | ICD-10-CM | POA: Diagnosis not present

## 2020-10-10 DIAGNOSIS — H524 Presbyopia: Secondary | ICD-10-CM | POA: Diagnosis not present

## 2020-10-31 DIAGNOSIS — N183 Chronic kidney disease, stage 3 unspecified: Secondary | ICD-10-CM | POA: Diagnosis not present

## 2020-10-31 DIAGNOSIS — I10 Essential (primary) hypertension: Secondary | ICD-10-CM | POA: Diagnosis not present

## 2020-10-31 DIAGNOSIS — E1165 Type 2 diabetes mellitus with hyperglycemia: Secondary | ICD-10-CM | POA: Diagnosis not present

## 2020-10-31 DIAGNOSIS — E782 Mixed hyperlipidemia: Secondary | ICD-10-CM | POA: Diagnosis not present

## 2020-11-02 DIAGNOSIS — N1832 Chronic kidney disease, stage 3b: Secondary | ICD-10-CM | POA: Diagnosis not present

## 2020-11-02 DIAGNOSIS — I10 Essential (primary) hypertension: Secondary | ICD-10-CM | POA: Diagnosis not present

## 2020-11-02 DIAGNOSIS — E1122 Type 2 diabetes mellitus with diabetic chronic kidney disease: Secondary | ICD-10-CM | POA: Diagnosis not present

## 2020-11-02 DIAGNOSIS — E039 Hypothyroidism, unspecified: Secondary | ICD-10-CM | POA: Diagnosis not present

## 2020-11-02 DIAGNOSIS — R5383 Other fatigue: Secondary | ICD-10-CM | POA: Diagnosis not present

## 2020-11-02 DIAGNOSIS — E7849 Other hyperlipidemia: Secondary | ICD-10-CM | POA: Diagnosis not present

## 2020-11-02 DIAGNOSIS — K2100 Gastro-esophageal reflux disease with esophagitis, without bleeding: Secondary | ICD-10-CM | POA: Diagnosis not present

## 2020-11-02 DIAGNOSIS — E782 Mixed hyperlipidemia: Secondary | ICD-10-CM | POA: Diagnosis not present

## 2020-11-07 DIAGNOSIS — Z681 Body mass index (BMI) 19 or less, adult: Secondary | ICD-10-CM | POA: Diagnosis not present

## 2020-11-07 DIAGNOSIS — E7849 Other hyperlipidemia: Secondary | ICD-10-CM | POA: Diagnosis not present

## 2020-11-07 DIAGNOSIS — I5022 Chronic systolic (congestive) heart failure: Secondary | ICD-10-CM | POA: Diagnosis not present

## 2020-11-07 DIAGNOSIS — I10 Essential (primary) hypertension: Secondary | ICD-10-CM | POA: Diagnosis not present

## 2020-11-07 DIAGNOSIS — I482 Chronic atrial fibrillation, unspecified: Secondary | ICD-10-CM | POA: Diagnosis not present

## 2020-11-07 DIAGNOSIS — E1165 Type 2 diabetes mellitus with hyperglycemia: Secondary | ICD-10-CM | POA: Diagnosis not present

## 2020-11-07 DIAGNOSIS — Z23 Encounter for immunization: Secondary | ICD-10-CM | POA: Diagnosis not present

## 2020-11-07 DIAGNOSIS — E1142 Type 2 diabetes mellitus with diabetic polyneuropathy: Secondary | ICD-10-CM | POA: Diagnosis not present

## 2020-11-07 DIAGNOSIS — E1122 Type 2 diabetes mellitus with diabetic chronic kidney disease: Secondary | ICD-10-CM | POA: Diagnosis not present

## 2020-11-07 DIAGNOSIS — K439 Ventral hernia without obstruction or gangrene: Secondary | ICD-10-CM | POA: Diagnosis not present

## 2020-11-30 DIAGNOSIS — N183 Chronic kidney disease, stage 3 unspecified: Secondary | ICD-10-CM | POA: Diagnosis not present

## 2020-11-30 DIAGNOSIS — I10 Essential (primary) hypertension: Secondary | ICD-10-CM | POA: Diagnosis not present

## 2020-11-30 DIAGNOSIS — E1165 Type 2 diabetes mellitus with hyperglycemia: Secondary | ICD-10-CM | POA: Diagnosis not present

## 2020-11-30 DIAGNOSIS — E782 Mixed hyperlipidemia: Secondary | ICD-10-CM | POA: Diagnosis not present

## 2020-12-04 DIAGNOSIS — L84 Corns and callosities: Secondary | ICD-10-CM | POA: Diagnosis not present

## 2020-12-04 DIAGNOSIS — E1142 Type 2 diabetes mellitus with diabetic polyneuropathy: Secondary | ICD-10-CM | POA: Diagnosis not present

## 2020-12-04 DIAGNOSIS — M79676 Pain in unspecified toe(s): Secondary | ICD-10-CM | POA: Diagnosis not present

## 2020-12-04 DIAGNOSIS — B351 Tinea unguium: Secondary | ICD-10-CM | POA: Diagnosis not present

## 2020-12-31 DIAGNOSIS — E1165 Type 2 diabetes mellitus with hyperglycemia: Secondary | ICD-10-CM | POA: Diagnosis not present

## 2020-12-31 DIAGNOSIS — I10 Essential (primary) hypertension: Secondary | ICD-10-CM | POA: Diagnosis not present

## 2020-12-31 DIAGNOSIS — E782 Mixed hyperlipidemia: Secondary | ICD-10-CM | POA: Diagnosis not present

## 2020-12-31 DIAGNOSIS — N183 Chronic kidney disease, stage 3 unspecified: Secondary | ICD-10-CM | POA: Diagnosis not present

## 2021-01-02 DIAGNOSIS — Z23 Encounter for immunization: Secondary | ICD-10-CM | POA: Diagnosis not present

## 2021-02-12 DIAGNOSIS — M79676 Pain in unspecified toe(s): Secondary | ICD-10-CM | POA: Diagnosis not present

## 2021-02-12 DIAGNOSIS — E1142 Type 2 diabetes mellitus with diabetic polyneuropathy: Secondary | ICD-10-CM | POA: Diagnosis not present

## 2021-02-12 DIAGNOSIS — L84 Corns and callosities: Secondary | ICD-10-CM | POA: Diagnosis not present

## 2021-02-12 DIAGNOSIS — B351 Tinea unguium: Secondary | ICD-10-CM | POA: Diagnosis not present

## 2021-02-14 DIAGNOSIS — M19012 Primary osteoarthritis, left shoulder: Secondary | ICD-10-CM | POA: Diagnosis not present

## 2021-02-14 DIAGNOSIS — K2100 Gastro-esophageal reflux disease with esophagitis, without bleeding: Secondary | ICD-10-CM | POA: Diagnosis not present

## 2021-03-12 DIAGNOSIS — E782 Mixed hyperlipidemia: Secondary | ICD-10-CM | POA: Diagnosis not present

## 2021-03-12 DIAGNOSIS — N1832 Chronic kidney disease, stage 3b: Secondary | ICD-10-CM | POA: Diagnosis not present

## 2021-03-12 DIAGNOSIS — E039 Hypothyroidism, unspecified: Secondary | ICD-10-CM | POA: Diagnosis not present

## 2021-03-12 DIAGNOSIS — D649 Anemia, unspecified: Secondary | ICD-10-CM | POA: Diagnosis not present

## 2021-03-12 DIAGNOSIS — K219 Gastro-esophageal reflux disease without esophagitis: Secondary | ICD-10-CM | POA: Diagnosis not present

## 2021-03-12 DIAGNOSIS — I10 Essential (primary) hypertension: Secondary | ICD-10-CM | POA: Diagnosis not present

## 2021-03-12 DIAGNOSIS — D529 Folate deficiency anemia, unspecified: Secondary | ICD-10-CM | POA: Diagnosis not present

## 2021-03-12 DIAGNOSIS — E1122 Type 2 diabetes mellitus with diabetic chronic kidney disease: Secondary | ICD-10-CM | POA: Diagnosis not present

## 2021-03-12 DIAGNOSIS — D519 Vitamin B12 deficiency anemia, unspecified: Secondary | ICD-10-CM | POA: Diagnosis not present

## 2021-03-12 DIAGNOSIS — R5383 Other fatigue: Secondary | ICD-10-CM | POA: Diagnosis not present

## 2021-03-12 DIAGNOSIS — E7849 Other hyperlipidemia: Secondary | ICD-10-CM | POA: Diagnosis not present

## 2021-03-14 DIAGNOSIS — E7849 Other hyperlipidemia: Secondary | ICD-10-CM | POA: Diagnosis not present

## 2021-03-14 DIAGNOSIS — F331 Major depressive disorder, recurrent, moderate: Secondary | ICD-10-CM | POA: Diagnosis not present

## 2021-03-14 DIAGNOSIS — I5022 Chronic systolic (congestive) heart failure: Secondary | ICD-10-CM | POA: Diagnosis not present

## 2021-03-14 DIAGNOSIS — I10 Essential (primary) hypertension: Secondary | ICD-10-CM | POA: Diagnosis not present

## 2021-03-14 DIAGNOSIS — D692 Other nonthrombocytopenic purpura: Secondary | ICD-10-CM | POA: Diagnosis not present

## 2021-03-14 DIAGNOSIS — K439 Ventral hernia without obstruction or gangrene: Secondary | ICD-10-CM | POA: Diagnosis not present

## 2021-03-14 DIAGNOSIS — E1122 Type 2 diabetes mellitus with diabetic chronic kidney disease: Secondary | ICD-10-CM | POA: Diagnosis not present

## 2021-03-14 DIAGNOSIS — N1832 Chronic kidney disease, stage 3b: Secondary | ICD-10-CM | POA: Diagnosis not present

## 2021-04-23 DIAGNOSIS — L84 Corns and callosities: Secondary | ICD-10-CM | POA: Diagnosis not present

## 2021-04-23 DIAGNOSIS — M79676 Pain in unspecified toe(s): Secondary | ICD-10-CM | POA: Diagnosis not present

## 2021-04-23 DIAGNOSIS — E1142 Type 2 diabetes mellitus with diabetic polyneuropathy: Secondary | ICD-10-CM | POA: Diagnosis not present

## 2021-04-23 DIAGNOSIS — B351 Tinea unguium: Secondary | ICD-10-CM | POA: Diagnosis not present

## 2021-05-31 DIAGNOSIS — E1165 Type 2 diabetes mellitus with hyperglycemia: Secondary | ICD-10-CM | POA: Diagnosis not present

## 2021-05-31 DIAGNOSIS — I10 Essential (primary) hypertension: Secondary | ICD-10-CM | POA: Diagnosis not present

## 2021-05-31 DIAGNOSIS — E782 Mixed hyperlipidemia: Secondary | ICD-10-CM | POA: Diagnosis not present

## 2021-06-05 DIAGNOSIS — N183 Chronic kidney disease, stage 3 unspecified: Secondary | ICD-10-CM | POA: Diagnosis not present

## 2021-06-05 DIAGNOSIS — D649 Anemia, unspecified: Secondary | ICD-10-CM | POA: Diagnosis not present

## 2021-06-05 DIAGNOSIS — E1165 Type 2 diabetes mellitus with hyperglycemia: Secondary | ICD-10-CM | POA: Diagnosis not present

## 2021-06-05 DIAGNOSIS — E782 Mixed hyperlipidemia: Secondary | ICD-10-CM | POA: Diagnosis not present

## 2021-06-05 DIAGNOSIS — N189 Chronic kidney disease, unspecified: Secondary | ICD-10-CM | POA: Diagnosis not present

## 2021-06-05 DIAGNOSIS — E039 Hypothyroidism, unspecified: Secondary | ICD-10-CM | POA: Diagnosis not present

## 2021-06-05 DIAGNOSIS — E7849 Other hyperlipidemia: Secondary | ICD-10-CM | POA: Diagnosis not present

## 2021-06-05 DIAGNOSIS — R5383 Other fatigue: Secondary | ICD-10-CM | POA: Diagnosis not present

## 2021-06-11 DIAGNOSIS — K439 Ventral hernia without obstruction or gangrene: Secondary | ICD-10-CM | POA: Diagnosis not present

## 2021-06-11 DIAGNOSIS — E1142 Type 2 diabetes mellitus with diabetic polyneuropathy: Secondary | ICD-10-CM | POA: Diagnosis not present

## 2021-06-11 DIAGNOSIS — I10 Essential (primary) hypertension: Secondary | ICD-10-CM | POA: Diagnosis not present

## 2021-06-11 DIAGNOSIS — E1122 Type 2 diabetes mellitus with diabetic chronic kidney disease: Secondary | ICD-10-CM | POA: Diagnosis not present

## 2021-06-11 DIAGNOSIS — I482 Chronic atrial fibrillation, unspecified: Secondary | ICD-10-CM | POA: Diagnosis not present

## 2021-06-11 DIAGNOSIS — I5022 Chronic systolic (congestive) heart failure: Secondary | ICD-10-CM | POA: Diagnosis not present

## 2021-06-11 DIAGNOSIS — I25111 Atherosclerotic heart disease of native coronary artery with angina pectoris with documented spasm: Secondary | ICD-10-CM | POA: Diagnosis not present

## 2021-06-11 DIAGNOSIS — E7849 Other hyperlipidemia: Secondary | ICD-10-CM | POA: Diagnosis not present

## 2021-06-19 ENCOUNTER — Ambulatory Visit: Payer: Medicare HMO | Admitting: Cardiology

## 2021-06-19 ENCOUNTER — Encounter: Payer: Self-pay | Admitting: *Deleted

## 2021-06-19 ENCOUNTER — Encounter: Payer: Self-pay | Admitting: Cardiology

## 2021-06-19 VITALS — BP 94/58 | HR 67 | Ht 64.0 in | Wt 124.0 lb

## 2021-06-19 DIAGNOSIS — I4821 Permanent atrial fibrillation: Secondary | ICD-10-CM | POA: Diagnosis not present

## 2021-06-19 DIAGNOSIS — Z8679 Personal history of other diseases of the circulatory system: Secondary | ICD-10-CM

## 2021-06-19 NOTE — Patient Instructions (Addendum)

## 2021-06-19 NOTE — Progress Notes (Signed)
? ? ?Cardiology Office Note ? ?Date: 06/19/2021  ? ?ID: Aimee Miles, DOB 1930/09/20, MRN [id number] ? ?PCP:  Richardean Chimera, MD  ?Cardiologist:  Nona Dell, MD ?Electrophysiologist:  None  ? ?Chief Complaint  ?Patient presents with  ? Cardiac follow-up  ? ? ?History of Present Illness: ?Aimee Miles is an 88 y.o. female last seen in May 2022.  She is here today with her daughter for a follow-up visit.  She has a good outlook in discussing her situation today.  She is recently saw Dr. Reuel Boom for a follow-up visit, we are requesting her recent lab work. ? ?I reviewed her medications today which are noted below.  Heart rate is well controlled and she reports no palpitations.  I personally reviewed her ECG which shows atrial fibrillation with IVCD. ? ?She does not report any spontaneous bleeding problems on Eliquis.  Blood pressure low normal today, she is asymptomatic. ? ?Past Medical History:  ?Diagnosis Date  ? Atrial fibrillation (HCC)   ? Chronic systolic (congestive) heart failure (HCC)   ? Hepatitis   ? History of pulmonary embolus (PE)   ? Bilateral PE January 2015 on Coumadin until June 2015  ? Hypothyroidism   ? LBBB (left bundle branch block)   ? ? ?Past Surgical History:  ?Procedure Laterality Date  ? CHOLECYSTECTOMY    ? EYE SURGERY    ? JOINT REPLACEMENT    ? ? ?Current Outpatient Medications  ?Medication Sig Dispense Refill  ? acetaminophen (TYLENOL) 325 MG tablet Take 325 mg by mouth as needed.    ? acetaminophen (TYLENOL) 650 MG CR tablet Take 1,300 mg by mouth 2 (two) times daily as needed for pain.    ? apixaban (ELIQUIS) 2.5 MG TABS tablet Take 2.5 mg by mouth 2 (two) times daily.    ? atorvastatin (LIPITOR) 10 MG tablet Take 1 tablet by mouth daily.    ? carvedilol (COREG) 12.5 MG tablet Take 12.5 mg by mouth 2 (two) times daily with a meal.    ? clobetasol ointment (TEMOVATE) 0.05 % Apply 1 application topically 2 (two) times daily as needed.    ? Coenzyme Q10 (COQ10) 50 MG CAPS Take 1  capsule by mouth daily.    ? folic acid (FOLVITE) 1 MG tablet Take 1 mg by mouth daily. Reported on 09/14/2015    ? furosemide (LASIX) 40 MG tablet Take 40 mg by mouth daily.    ? glipiZIDE (GLUCOTROL XL) 2.5 MG 24 hr tablet Take 1 tablet by mouth daily.    ? isosorbide mononitrate (IMDUR) 30 MG 24 hr tablet Take 30 mg by mouth daily.    ? levothyroxine (SYNTHROID) 125 MCG tablet Take 125 mcg by mouth daily before breakfast.    ? losartan (COZAAR) 25 MG tablet Take 0.5 tablets (12.5 mg total) by mouth daily. 45 tablet 2  ? Multiple Vitamins-Minerals (MULTIVITAMIN ADULTS PO) Take 1 tablet by mouth 2 (two) times daily. [employer] Superior Women's Multivitamin    ? omeprazole (PRILOSEC) 20 MG capsule Take 20 mg by mouth daily.    ? potassium chloride (K-DUR) 10 MEQ tablet Take 10 mEq by mouth daily.    ? vitamin B-12 (CYANOCOBALAMIN) 100 MCG tablet Take 100 mcg by mouth daily.    ? ?No current facility-administered medications for this visit.  ? ?Allergies:  Codeine, Penicillins, and Sulfa antibiotics  ? ?ROS: Chronic hearing loss.  Arthritic pain. ? ?Physical Exam: ?VS:  BP (!) 94/58   Pulse 67  Ht 5\' 4"  (1.626 m)   Wt 124 lb (56.2 kg)   SpO2 97%   BMI 21.28 kg/m? , BMI Body mass index is 21.28 kg/m?. ? ?Wt Readings from Last 3 Encounters:  ?06/19/21 124 lb (56.2 kg)  ?07/17/20 125 lb (56.7 kg)  ?01/17/20 135 lb (61.2 kg)  ?  ?General: Patient appears comfortable at rest. ?HEENT: Conjunctiva and lids normal. ?Neck: Supple, no elevated JVP or carotid bruits, no thyromegaly. ?Lungs: Clear to auscultation, nonlabored breathing at rest. ?Cardiac: Irregularly irregular, 1/6 systolic murmur, no gallop. ?Extremities: No pitting edema. ? ?ECG:  An ECG dated 01/17/2020 was personally reviewed today and demonstrated:  Atrial fibrillation with IVCD. ? ?Recent Labwork: ? ?May 2021: TSH 5.21, BUN 25, creatinine 1.41, potassium 3.9, GFR 34, AST 21, ALT 14, hemoglobin 10.8, platelets 131, cholesterol 122, triglycerides 102,  HDL 46, LDL 57, hemoglobin A1c 7.3% ? ?Other Studies Reviewed Today: ? ?Echocardiogram 09/27/2015: ?Study Conclusions ?  ?- Left ventricle: The cavity size was normal. Wall thickness was ?  normal. Systolic function was moderately reduced. The estimated ?  ejection fraction was in the range of 35% to 40%. Diffuse ?  hypokinesis. The study is not technically sufficient to allow ?  evaluation of LV diastolic function. ?- Ventricular septum: Septal motion showed paradox. ?- Aortic valve: Calcified annulus. Trileaflet. ?- Mitral valve: There was trivial regurgitation. ?- Left atrium: The atrium was moderately dilated. ?- Right atrium: Central venous pressure (est): 3 mm Hg. ?- Atrial septum: The septum bowed from left to right, consistent ?  with increased left atrial pressure. No defect or patent foramen ?  ovale was identified. ?- Tricuspid valve: There was moderate regurgitation. ?- Pulmonary arteries: PA peak pressure: 35 mm Hg (S). ?- Pericardium, extracardiac: There was no pericardial effusion. ?  ?Impressions: ?  ?- Normal LV wall thickness with LVEF 35-40%. There is diffuse ?  hypokinesis with paradoxical septal motion consistent with left ?  bundle branch block. Indeterminate diastolic function in the ?  setting of atrial fibrillation. Moderate left atrial enlargement ?  with evidence of increased left atrial pressure. Trivial mitral ?  regurgitation. Mildly sclerotic aortic valve. Moderate tricuspid ?  regurgitation with PASP 35 mmHg. ? ?Assessment and Plan: ? ?1.  Permanent atrial fibrillation with CHA2DS2-VASc score of 4.  She remains asymptomatic in terms of palpitations and has good heart rate control on Coreg.  Continue Eliquis for stroke prophylaxis.  Requesting interval lab work from Dr. 09/29/2015. ? ?2.  History of nonischemic cardiomyopathy, managed conservatively per discussion and without interval imaging.  Currently on Coreg and Cozaar along with Lasix and potassium supplement. ? ?Medication  Adjustments/Labs and Tests Ordered: ?Current medicines are reviewed at length with the patient today.  Concerns regarding medicines are outlined above.  ? ?Tests Ordered: ?Orders Placed This Encounter  ?Procedures  ? EKG 12-Lead  ? ? ?Medication Changes: ?No orders of the defined types were placed in this encounter. ? ? ?Disposition:  Follow up  1 year. ? ?Signed, ?Reuel Boom, MD, [facility] ?06/19/2021 11:30 AM    ?[facility] ?[address] [place], [place], [place] ?Phone: [phone]; Fax: [phone]  ?

## 2021-07-02 DIAGNOSIS — B351 Tinea unguium: Secondary | ICD-10-CM | POA: Diagnosis not present

## 2021-07-02 DIAGNOSIS — L84 Corns and callosities: Secondary | ICD-10-CM | POA: Diagnosis not present

## 2021-07-02 DIAGNOSIS — M79676 Pain in unspecified toe(s): Secondary | ICD-10-CM | POA: Diagnosis not present

## 2021-07-02 DIAGNOSIS — E1142 Type 2 diabetes mellitus with diabetic polyneuropathy: Secondary | ICD-10-CM | POA: Diagnosis not present

## 2021-09-10 DIAGNOSIS — E1142 Type 2 diabetes mellitus with diabetic polyneuropathy: Secondary | ICD-10-CM | POA: Diagnosis not present

## 2021-09-10 DIAGNOSIS — B351 Tinea unguium: Secondary | ICD-10-CM | POA: Diagnosis not present

## 2021-09-10 DIAGNOSIS — L84 Corns and callosities: Secondary | ICD-10-CM | POA: Diagnosis not present

## 2021-09-10 DIAGNOSIS — M79676 Pain in unspecified toe(s): Secondary | ICD-10-CM | POA: Diagnosis not present

## 2021-10-11 DIAGNOSIS — E7849 Other hyperlipidemia: Secondary | ICD-10-CM | POA: Diagnosis not present

## 2021-10-11 DIAGNOSIS — E1165 Type 2 diabetes mellitus with hyperglycemia: Secondary | ICD-10-CM | POA: Diagnosis not present

## 2021-10-11 DIAGNOSIS — E559 Vitamin D deficiency, unspecified: Secondary | ICD-10-CM | POA: Diagnosis not present

## 2021-10-11 DIAGNOSIS — N1832 Chronic kidney disease, stage 3b: Secondary | ICD-10-CM | POA: Diagnosis not present

## 2021-10-11 DIAGNOSIS — D519 Vitamin B12 deficiency anemia, unspecified: Secondary | ICD-10-CM | POA: Diagnosis not present

## 2021-10-11 DIAGNOSIS — I10 Essential (primary) hypertension: Secondary | ICD-10-CM | POA: Diagnosis not present

## 2021-10-11 DIAGNOSIS — R5383 Other fatigue: Secondary | ICD-10-CM | POA: Diagnosis not present

## 2021-10-11 DIAGNOSIS — E039 Hypothyroidism, unspecified: Secondary | ICD-10-CM | POA: Diagnosis not present

## 2021-10-11 DIAGNOSIS — D649 Anemia, unspecified: Secondary | ICD-10-CM | POA: Diagnosis not present

## 2021-10-15 DIAGNOSIS — I10 Essential (primary) hypertension: Secondary | ICD-10-CM | POA: Diagnosis not present

## 2021-10-15 DIAGNOSIS — F331 Major depressive disorder, recurrent, moderate: Secondary | ICD-10-CM | POA: Diagnosis not present

## 2021-10-15 DIAGNOSIS — K439 Ventral hernia without obstruction or gangrene: Secondary | ICD-10-CM | POA: Diagnosis not present

## 2021-10-15 DIAGNOSIS — E7849 Other hyperlipidemia: Secondary | ICD-10-CM | POA: Diagnosis not present

## 2021-10-15 DIAGNOSIS — I5022 Chronic systolic (congestive) heart failure: Secondary | ICD-10-CM | POA: Diagnosis not present

## 2021-10-15 DIAGNOSIS — Z0001 Encounter for general adult medical examination with abnormal findings: Secondary | ICD-10-CM | POA: Diagnosis not present

## 2021-10-15 DIAGNOSIS — E1122 Type 2 diabetes mellitus with diabetic chronic kidney disease: Secondary | ICD-10-CM | POA: Diagnosis not present

## 2021-10-15 DIAGNOSIS — N1832 Chronic kidney disease, stage 3b: Secondary | ICD-10-CM | POA: Diagnosis not present

## 2021-10-15 DIAGNOSIS — I482 Chronic atrial fibrillation, unspecified: Secondary | ICD-10-CM | POA: Diagnosis not present

## 2021-10-15 DIAGNOSIS — D692 Other nonthrombocytopenic purpura: Secondary | ICD-10-CM | POA: Diagnosis not present

## 2021-10-16 DIAGNOSIS — E782 Mixed hyperlipidemia: Secondary | ICD-10-CM | POA: Diagnosis not present

## 2021-10-16 DIAGNOSIS — I5022 Chronic systolic (congestive) heart failure: Secondary | ICD-10-CM | POA: Diagnosis not present

## 2021-10-16 DIAGNOSIS — I10 Essential (primary) hypertension: Secondary | ICD-10-CM | POA: Diagnosis not present

## 2021-10-16 DIAGNOSIS — Z23 Encounter for immunization: Secondary | ICD-10-CM | POA: Diagnosis not present

## 2021-10-16 DIAGNOSIS — K439 Ventral hernia without obstruction or gangrene: Secondary | ICD-10-CM | POA: Diagnosis not present

## 2021-10-16 DIAGNOSIS — F331 Major depressive disorder, recurrent, moderate: Secondary | ICD-10-CM | POA: Diagnosis not present

## 2021-10-16 DIAGNOSIS — Z0001 Encounter for general adult medical examination with abnormal findings: Secondary | ICD-10-CM | POA: Diagnosis not present

## 2021-10-16 DIAGNOSIS — I482 Chronic atrial fibrillation, unspecified: Secondary | ICD-10-CM | POA: Diagnosis not present

## 2021-10-16 DIAGNOSIS — M1711 Unilateral primary osteoarthritis, right knee: Secondary | ICD-10-CM | POA: Diagnosis not present

## 2021-11-19 DIAGNOSIS — B351 Tinea unguium: Secondary | ICD-10-CM | POA: Diagnosis not present

## 2021-11-19 DIAGNOSIS — L84 Corns and callosities: Secondary | ICD-10-CM | POA: Diagnosis not present

## 2021-11-19 DIAGNOSIS — E1142 Type 2 diabetes mellitus with diabetic polyneuropathy: Secondary | ICD-10-CM | POA: Diagnosis not present

## 2021-11-19 DIAGNOSIS — M79676 Pain in unspecified toe(s): Secondary | ICD-10-CM | POA: Diagnosis not present

## 2021-12-12 DIAGNOSIS — H5213 Myopia, bilateral: Secondary | ICD-10-CM | POA: Diagnosis not present

## 2021-12-12 DIAGNOSIS — H524 Presbyopia: Secondary | ICD-10-CM | POA: Diagnosis not present

## 2021-12-12 DIAGNOSIS — H353131 Nonexudative age-related macular degeneration, bilateral, early dry stage: Secondary | ICD-10-CM | POA: Diagnosis not present

## 2021-12-30 DIAGNOSIS — S81801A Unspecified open wound, right lower leg, initial encounter: Secondary | ICD-10-CM | POA: Diagnosis not present

## 2021-12-30 DIAGNOSIS — S9032XA Contusion of left foot, initial encounter: Secondary | ICD-10-CM | POA: Diagnosis not present

## 2021-12-30 DIAGNOSIS — S8002XA Contusion of left knee, initial encounter: Secondary | ICD-10-CM | POA: Diagnosis not present

## 2021-12-30 DIAGNOSIS — S60212A Contusion of left wrist, initial encounter: Secondary | ICD-10-CM | POA: Diagnosis not present

## 2022-01-03 DIAGNOSIS — S8002XA Contusion of left knee, initial encounter: Secondary | ICD-10-CM | POA: Diagnosis not present

## 2022-01-03 DIAGNOSIS — S9032XA Contusion of left foot, initial encounter: Secondary | ICD-10-CM | POA: Diagnosis not present

## 2022-01-03 DIAGNOSIS — S60212A Contusion of left wrist, initial encounter: Secondary | ICD-10-CM | POA: Diagnosis not present

## 2022-01-03 DIAGNOSIS — S92902D Unspecified fracture of left foot, subsequent encounter for fracture with routine healing: Secondary | ICD-10-CM | POA: Diagnosis not present

## 2022-01-03 DIAGNOSIS — S81801A Unspecified open wound, right lower leg, initial encounter: Secondary | ICD-10-CM | POA: Diagnosis not present

## 2022-01-16 DIAGNOSIS — S81801A Unspecified open wound, right lower leg, initial encounter: Secondary | ICD-10-CM | POA: Diagnosis not present

## 2022-01-16 DIAGNOSIS — S9032XA Contusion of left foot, initial encounter: Secondary | ICD-10-CM | POA: Diagnosis not present

## 2022-01-16 DIAGNOSIS — S8002XA Contusion of left knee, initial encounter: Secondary | ICD-10-CM | POA: Diagnosis not present

## 2022-01-16 DIAGNOSIS — S60212A Contusion of left wrist, initial encounter: Secondary | ICD-10-CM | POA: Diagnosis not present

## 2022-01-16 DIAGNOSIS — S92902D Unspecified fracture of left foot, subsequent encounter for fracture with routine healing: Secondary | ICD-10-CM | POA: Diagnosis not present

## 2022-01-22 DIAGNOSIS — J40 Bronchitis, not specified as acute or chronic: Secondary | ICD-10-CM | POA: Diagnosis not present

## 2022-01-28 DIAGNOSIS — E1142 Type 2 diabetes mellitus with diabetic polyneuropathy: Secondary | ICD-10-CM | POA: Diagnosis not present

## 2022-01-28 DIAGNOSIS — B351 Tinea unguium: Secondary | ICD-10-CM | POA: Diagnosis not present

## 2022-01-28 DIAGNOSIS — L84 Corns and callosities: Secondary | ICD-10-CM | POA: Diagnosis not present

## 2022-01-28 DIAGNOSIS — M79676 Pain in unspecified toe(s): Secondary | ICD-10-CM | POA: Diagnosis not present

## 2022-02-05 DIAGNOSIS — E7849 Other hyperlipidemia: Secondary | ICD-10-CM | POA: Diagnosis not present

## 2022-02-05 DIAGNOSIS — E1122 Type 2 diabetes mellitus with diabetic chronic kidney disease: Secondary | ICD-10-CM | POA: Diagnosis not present

## 2022-02-05 DIAGNOSIS — I10 Essential (primary) hypertension: Secondary | ICD-10-CM | POA: Diagnosis not present

## 2022-02-05 DIAGNOSIS — E1165 Type 2 diabetes mellitus with hyperglycemia: Secondary | ICD-10-CM | POA: Diagnosis not present

## 2022-02-05 DIAGNOSIS — N183 Chronic kidney disease, stage 3 unspecified: Secondary | ICD-10-CM | POA: Diagnosis not present

## 2022-02-05 DIAGNOSIS — K219 Gastro-esophageal reflux disease without esophagitis: Secondary | ICD-10-CM | POA: Diagnosis not present

## 2022-02-06 DIAGNOSIS — E782 Mixed hyperlipidemia: Secondary | ICD-10-CM | POA: Diagnosis not present

## 2022-02-06 DIAGNOSIS — R5383 Other fatigue: Secondary | ICD-10-CM | POA: Diagnosis not present

## 2022-02-06 DIAGNOSIS — E1122 Type 2 diabetes mellitus with diabetic chronic kidney disease: Secondary | ICD-10-CM | POA: Diagnosis not present

## 2022-02-06 DIAGNOSIS — K219 Gastro-esophageal reflux disease without esophagitis: Secondary | ICD-10-CM | POA: Diagnosis not present

## 2022-02-06 DIAGNOSIS — I10 Essential (primary) hypertension: Secondary | ICD-10-CM | POA: Diagnosis not present

## 2022-02-06 DIAGNOSIS — E1165 Type 2 diabetes mellitus with hyperglycemia: Secondary | ICD-10-CM | POA: Diagnosis not present

## 2022-02-06 DIAGNOSIS — N183 Chronic kidney disease, stage 3 unspecified: Secondary | ICD-10-CM | POA: Diagnosis not present

## 2022-02-12 DIAGNOSIS — M1712 Unilateral primary osteoarthritis, left knee: Secondary | ICD-10-CM | POA: Diagnosis not present

## 2022-02-12 DIAGNOSIS — D692 Other nonthrombocytopenic purpura: Secondary | ICD-10-CM | POA: Diagnosis not present

## 2022-02-12 DIAGNOSIS — I25111 Atherosclerotic heart disease of native coronary artery with angina pectoris with documented spasm: Secondary | ICD-10-CM | POA: Diagnosis not present

## 2022-02-12 DIAGNOSIS — E7849 Other hyperlipidemia: Secondary | ICD-10-CM | POA: Diagnosis not present

## 2022-02-12 DIAGNOSIS — K439 Ventral hernia without obstruction or gangrene: Secondary | ICD-10-CM | POA: Diagnosis not present

## 2022-02-12 DIAGNOSIS — I5022 Chronic systolic (congestive) heart failure: Secondary | ICD-10-CM | POA: Diagnosis not present

## 2022-02-12 DIAGNOSIS — E1122 Type 2 diabetes mellitus with diabetic chronic kidney disease: Secondary | ICD-10-CM | POA: Diagnosis not present

## 2022-02-12 DIAGNOSIS — E1142 Type 2 diabetes mellitus with diabetic polyneuropathy: Secondary | ICD-10-CM | POA: Diagnosis not present

## 2022-02-12 DIAGNOSIS — I482 Chronic atrial fibrillation, unspecified: Secondary | ICD-10-CM | POA: Diagnosis not present

## 2022-03-25 DIAGNOSIS — E039 Hypothyroidism, unspecified: Secondary | ICD-10-CM | POA: Diagnosis not present

## 2022-04-08 DIAGNOSIS — L84 Corns and callosities: Secondary | ICD-10-CM | POA: Diagnosis not present

## 2022-04-08 DIAGNOSIS — M79676 Pain in unspecified toe(s): Secondary | ICD-10-CM | POA: Diagnosis not present

## 2022-04-08 DIAGNOSIS — E1142 Type 2 diabetes mellitus with diabetic polyneuropathy: Secondary | ICD-10-CM | POA: Diagnosis not present

## 2022-04-08 DIAGNOSIS — B351 Tinea unguium: Secondary | ICD-10-CM | POA: Diagnosis not present

## 2022-06-05 DIAGNOSIS — D519 Vitamin B12 deficiency anemia, unspecified: Secondary | ICD-10-CM | POA: Diagnosis not present

## 2022-06-05 DIAGNOSIS — R5383 Other fatigue: Secondary | ICD-10-CM | POA: Diagnosis not present

## 2022-06-05 DIAGNOSIS — E559 Vitamin D deficiency, unspecified: Secondary | ICD-10-CM | POA: Diagnosis not present

## 2022-06-05 DIAGNOSIS — E1165 Type 2 diabetes mellitus with hyperglycemia: Secondary | ICD-10-CM | POA: Diagnosis not present

## 2022-06-05 DIAGNOSIS — E7849 Other hyperlipidemia: Secondary | ICD-10-CM | POA: Diagnosis not present

## 2022-06-05 DIAGNOSIS — E039 Hypothyroidism, unspecified: Secondary | ICD-10-CM | POA: Diagnosis not present

## 2022-06-05 DIAGNOSIS — N1832 Chronic kidney disease, stage 3b: Secondary | ICD-10-CM | POA: Diagnosis not present

## 2022-06-12 DIAGNOSIS — I5022 Chronic systolic (congestive) heart failure: Secondary | ICD-10-CM | POA: Diagnosis not present

## 2022-06-12 DIAGNOSIS — M1711 Unilateral primary osteoarthritis, right knee: Secondary | ICD-10-CM | POA: Diagnosis not present

## 2022-06-12 DIAGNOSIS — E1142 Type 2 diabetes mellitus with diabetic polyneuropathy: Secondary | ICD-10-CM | POA: Diagnosis not present

## 2022-06-12 DIAGNOSIS — F331 Major depressive disorder, recurrent, moderate: Secondary | ICD-10-CM | POA: Diagnosis not present

## 2022-06-12 DIAGNOSIS — E7849 Other hyperlipidemia: Secondary | ICD-10-CM | POA: Diagnosis not present

## 2022-06-12 DIAGNOSIS — K439 Ventral hernia without obstruction or gangrene: Secondary | ICD-10-CM | POA: Diagnosis not present

## 2022-06-12 DIAGNOSIS — I482 Chronic atrial fibrillation, unspecified: Secondary | ICD-10-CM | POA: Diagnosis not present

## 2022-06-12 DIAGNOSIS — D692 Other nonthrombocytopenic purpura: Secondary | ICD-10-CM | POA: Diagnosis not present

## 2022-06-12 DIAGNOSIS — E1122 Type 2 diabetes mellitus with diabetic chronic kidney disease: Secondary | ICD-10-CM | POA: Diagnosis not present

## 2022-06-13 ENCOUNTER — Ambulatory Visit: Payer: Medicare HMO | Attending: Cardiology | Admitting: Cardiology

## 2022-06-13 ENCOUNTER — Encounter: Payer: Self-pay | Admitting: *Deleted

## 2022-06-13 ENCOUNTER — Encounter: Payer: Self-pay | Admitting: Cardiology

## 2022-06-13 VITALS — BP 114/58 | HR 59 | Ht 62.0 in | Wt 134.0 lb

## 2022-06-13 DIAGNOSIS — I4821 Permanent atrial fibrillation: Secondary | ICD-10-CM | POA: Diagnosis not present

## 2022-06-13 DIAGNOSIS — Z8679 Personal history of other diseases of the circulatory system: Secondary | ICD-10-CM

## 2022-06-13 NOTE — Progress Notes (Signed)
    Cardiology Office Note  Date: 06/13/2022   ID: Aimee Miles, DOB 1930/04/18, MRN [id number]  History of Present Illness: Aimee Miles is a 89 y.o. female last seen in April 2023.  She is here today with her daughter for follow-up visit.  Overall no major change from a cardiac perspective.  She does not report any sense of palpitations, no exertional chest pain.  Limited by arthritic symptoms, also bilateral rotator cuff problems.  She is in a wheelchair today.  Does enjoy doing puzzles regularly, anywhere from 500 to 1000 pieces.  I went over her medications.  She is tolerating Coreg 12.5 mg twice daily, also on Eliquis without spontaneous bleeding problems.  Requesting recent lab work from PCP visit.  ECG today shows atrial fibrillation with IVCD and LVH, nonspecific ST changes.  Physical Exam: VS:  BP (!) 114/58   Pulse (!) 59   Ht 5\' 2"  (1.575 m)   Wt 134 lb (60.8 kg)   SpO2 95%   BMI 24.51 kg/m , BMI Body mass index is 24.51 kg/m.  Wt Readings from Last 3 Encounters:  06/13/22 134 lb (60.8 kg)  06/19/21 124 lb (56.2 kg)  07/17/20 125 lb (56.7 kg)    General: Patient appears comfortable at rest. HEENT: Conjunctiva and lids normal. Neck: Supple, no elevated JVP or carotid bruits. Lungs: Clear to auscultation, nonlabored breathing at rest. Cardiac: Irregularly irregular without gallop, 1/6 systolic murmur. Extremities: No pitting edema.  ECG:  An ECG dated 06/19/2021 was personally reviewed today and demonstrated:  Atrial fibrillation with IVCD.  Labwork:  December 2023: BUN 19, creatinine 1.18, potassium 4.2, AST 21, ALT 22, cholesterol 130, triglycerides 90, HDL 57, LDL 56, TSH 13.1  Other Studies Reviewed Today:  No interval cardiac testing for review today.  Assessment and Plan:  1.  Permanent atrial fibrillation with CHA2DS2-VASc score of 4.  She is asymptomatic without sense of palpitations and tolerating current dose of Coreg.  Keep an eye on heart rate  overall as this may need to be down titrated eventually.  She remains on Eliquis at appropriate dose for stroke prophylaxis.  No spontaneous bleeding problems reported.  Requesting interval lab work from PCP.  2.  HFrEF with history of nonischemic cardiomyopathy, LVEF 35 to 40% by echocardiogram in 2017.  This has been followed conservatively without reimaging per discussion.  Disposition:  Follow up  6 months.  Signed, Jonelle Sidle, M.D., F.A.C.C. [facility]

## 2022-06-13 NOTE — Patient Instructions (Addendum)

## 2022-07-15 DIAGNOSIS — E1142 Type 2 diabetes mellitus with diabetic polyneuropathy: Secondary | ICD-10-CM | POA: Diagnosis not present

## 2022-07-15 DIAGNOSIS — L84 Corns and callosities: Secondary | ICD-10-CM | POA: Diagnosis not present

## 2022-07-15 DIAGNOSIS — M79676 Pain in unspecified toe(s): Secondary | ICD-10-CM | POA: Diagnosis not present

## 2022-07-15 DIAGNOSIS — B351 Tinea unguium: Secondary | ICD-10-CM | POA: Diagnosis not present

## 2022-07-29 DIAGNOSIS — R21 Rash and other nonspecific skin eruption: Secondary | ICD-10-CM | POA: Diagnosis not present

## 2022-08-01 DIAGNOSIS — I5022 Chronic systolic (congestive) heart failure: Secondary | ICD-10-CM | POA: Diagnosis not present

## 2022-08-01 DIAGNOSIS — E1122 Type 2 diabetes mellitus with diabetic chronic kidney disease: Secondary | ICD-10-CM | POA: Diagnosis not present

## 2022-08-09 DIAGNOSIS — R21 Rash and other nonspecific skin eruption: Secondary | ICD-10-CM | POA: Diagnosis not present

## 2022-08-09 DIAGNOSIS — T7840XA Allergy, unspecified, initial encounter: Secondary | ICD-10-CM | POA: Diagnosis not present

## 2022-09-23 DIAGNOSIS — L84 Corns and callosities: Secondary | ICD-10-CM | POA: Diagnosis not present

## 2022-09-23 DIAGNOSIS — M79676 Pain in unspecified toe(s): Secondary | ICD-10-CM | POA: Diagnosis not present

## 2022-09-23 DIAGNOSIS — E1142 Type 2 diabetes mellitus with diabetic polyneuropathy: Secondary | ICD-10-CM | POA: Diagnosis not present

## 2022-09-23 DIAGNOSIS — B351 Tinea unguium: Secondary | ICD-10-CM | POA: Diagnosis not present

## 2022-10-15 DIAGNOSIS — N183 Chronic kidney disease, stage 3 unspecified: Secondary | ICD-10-CM | POA: Diagnosis not present

## 2022-10-15 DIAGNOSIS — E1165 Type 2 diabetes mellitus with hyperglycemia: Secondary | ICD-10-CM | POA: Diagnosis not present

## 2022-10-15 DIAGNOSIS — E1142 Type 2 diabetes mellitus with diabetic polyneuropathy: Secondary | ICD-10-CM | POA: Diagnosis not present

## 2022-10-15 DIAGNOSIS — E1122 Type 2 diabetes mellitus with diabetic chronic kidney disease: Secondary | ICD-10-CM | POA: Diagnosis not present

## 2022-10-15 DIAGNOSIS — E039 Hypothyroidism, unspecified: Secondary | ICD-10-CM | POA: Diagnosis not present

## 2022-10-15 DIAGNOSIS — E782 Mixed hyperlipidemia: Secondary | ICD-10-CM | POA: Diagnosis not present

## 2022-10-15 DIAGNOSIS — E7849 Other hyperlipidemia: Secondary | ICD-10-CM | POA: Diagnosis not present

## 2022-10-22 DIAGNOSIS — N1832 Chronic kidney disease, stage 3b: Secondary | ICD-10-CM | POA: Diagnosis not present

## 2022-10-22 DIAGNOSIS — F331 Major depressive disorder, recurrent, moderate: Secondary | ICD-10-CM | POA: Diagnosis not present

## 2022-10-22 DIAGNOSIS — K439 Ventral hernia without obstruction or gangrene: Secondary | ICD-10-CM | POA: Diagnosis not present

## 2022-10-22 DIAGNOSIS — Z23 Encounter for immunization: Secondary | ICD-10-CM | POA: Diagnosis not present

## 2022-10-22 DIAGNOSIS — I5022 Chronic systolic (congestive) heart failure: Secondary | ICD-10-CM | POA: Diagnosis not present

## 2022-10-22 DIAGNOSIS — M1711 Unilateral primary osteoarthritis, right knee: Secondary | ICD-10-CM | POA: Diagnosis not present

## 2022-10-22 DIAGNOSIS — I10 Essential (primary) hypertension: Secondary | ICD-10-CM | POA: Diagnosis not present

## 2022-10-22 DIAGNOSIS — E782 Mixed hyperlipidemia: Secondary | ICD-10-CM | POA: Diagnosis not present

## 2022-10-22 DIAGNOSIS — I482 Chronic atrial fibrillation, unspecified: Secondary | ICD-10-CM | POA: Diagnosis not present

## 2022-10-22 DIAGNOSIS — Z0001 Encounter for general adult medical examination with abnormal findings: Secondary | ICD-10-CM | POA: Diagnosis not present

## 2022-10-22 DIAGNOSIS — E1122 Type 2 diabetes mellitus with diabetic chronic kidney disease: Secondary | ICD-10-CM | POA: Diagnosis not present

## 2022-10-22 DIAGNOSIS — E1142 Type 2 diabetes mellitus with diabetic polyneuropathy: Secondary | ICD-10-CM | POA: Diagnosis not present

## 2022-10-22 DIAGNOSIS — M1712 Unilateral primary osteoarthritis, left knee: Secondary | ICD-10-CM | POA: Diagnosis not present

## 2022-12-01 DIAGNOSIS — E039 Hypothyroidism, unspecified: Secondary | ICD-10-CM | POA: Diagnosis not present

## 2022-12-02 DIAGNOSIS — L84 Corns and callosities: Secondary | ICD-10-CM | POA: Diagnosis not present

## 2022-12-02 DIAGNOSIS — M79676 Pain in unspecified toe(s): Secondary | ICD-10-CM | POA: Diagnosis not present

## 2022-12-02 DIAGNOSIS — B351 Tinea unguium: Secondary | ICD-10-CM | POA: Diagnosis not present

## 2022-12-02 DIAGNOSIS — E1142 Type 2 diabetes mellitus with diabetic polyneuropathy: Secondary | ICD-10-CM | POA: Diagnosis not present

## 2022-12-16 DIAGNOSIS — H43813 Vitreous degeneration, bilateral: Secondary | ICD-10-CM | POA: Diagnosis not present

## 2022-12-16 DIAGNOSIS — H524 Presbyopia: Secondary | ICD-10-CM | POA: Diagnosis not present

## 2022-12-18 ENCOUNTER — Ambulatory Visit: Payer: Medicare HMO | Admitting: Cardiology

## 2022-12-22 DIAGNOSIS — L26 Exfoliative dermatitis: Secondary | ICD-10-CM | POA: Diagnosis not present

## 2022-12-29 ENCOUNTER — Encounter: Payer: Self-pay | Admitting: Cardiology

## 2022-12-29 ENCOUNTER — Ambulatory Visit: Payer: Medicare HMO | Attending: Cardiology | Admitting: Cardiology

## 2022-12-29 VITALS — BP 106/58 | HR 66 | Ht 65.0 in | Wt 122.0 lb

## 2022-12-29 DIAGNOSIS — I4821 Permanent atrial fibrillation: Secondary | ICD-10-CM | POA: Diagnosis not present

## 2022-12-29 DIAGNOSIS — J40 Bronchitis, not specified as acute or chronic: Secondary | ICD-10-CM | POA: Diagnosis not present

## 2022-12-29 DIAGNOSIS — I5020 Unspecified systolic (congestive) heart failure: Secondary | ICD-10-CM | POA: Diagnosis not present

## 2022-12-29 DIAGNOSIS — J329 Chronic sinusitis, unspecified: Secondary | ICD-10-CM | POA: Diagnosis not present

## 2022-12-29 DIAGNOSIS — Z681 Body mass index (BMI) 19 or less, adult: Secondary | ICD-10-CM | POA: Diagnosis not present

## 2022-12-29 MED ORDER — CARVEDILOL 6.25 MG PO TABS: 6.2500 mg | ORAL_TABLET | Freq: Two times a day (BID) | ORAL | 3 refills | Status: DC

## 2022-12-29 NOTE — Progress Notes (Signed)
Cardiology Office Note  Date: 12/29/2022   ID: Aimee Miles, DOB 1930-12-13, MRN [id number]  History of Present Illness: Aimee Miles is a 89 y.o. female last seen in April.  She is here today with her daughter for a follow-up visit.  Reports no palpitations or chest pain.  She has had a recent cough, intermittently productive.  Took an OTC cough suppressant but plans to see Dr. Reuel Boom given persistent symptoms.  I reviewed her medications.  Current cardiac regimen includes Eliquis, Lipitor, Coreg, Imdur, and Lasix with potassium supplement.  We discussed reducing her Coreg dose to 6.25 mg twice daily.  I review her home blood pressure checks.  Lab work from August is noted below.  Physical Exam: VS:  BP (!) 106/58 (BP Location: Right Arm)   Pulse 66   Ht 5\' 5"  (1.651 m)   Wt 122 lb (55.3 kg)   SpO2 95%   BMI 20.30 kg/m , BMI Body mass index is 20.3 kg/m.  Wt Readings from Last 3 Encounters:  12/29/22 122 lb (55.3 kg)  06/13/22 134 lb (60.8 kg)  06/19/21 124 lb (56.2 kg)    General: Patient appears comfortable at rest. HEENT: Conjunctiva and lids normal. Lungs: No wheezing, somewhat coarse breath sounds without egophony. Cardiac: Irregularly irregular, 1/6 systolic murmur, no gallop. Extremities: No pitting edema.  ECG:  An ECG dated 06/13/2022 was personally reviewed today and demonstrated:  Atrial fibrillation with IVCD and LVH, nonspecific ST changes.  Labwork:  August 2024: BUN 35, creatinine 1.36, potassium 4.3, AST 27, ALT 23, cholesterol 127, triglycerides 84, HDL 57, LDL 54, hemoglobin A1c 8.6%, TSH 0.269  Other Studies Reviewed Today:  No interval cardiac testing for review today.  Assessment and Plan:  1.  Permanent atrial fibrillation with CHA2DS2-VASc score of 4.  She remains asymptomatic.  Continue Eliquis 2.5 mg twice daily for stroke prophylaxis.  Reduce Coreg to 6.25 mg twice daily.  I reviewed her lab work from August.  She denies any spontaneous  bleeding problems.   2.  HFrEF with history of nonischemic cardiomyopathy, LVEF 35 to 40% by echocardiogram in 2017.  This has been followed conservatively without reimaging per discussion.  Disposition:  Follow up  6 months.  Signed, Jonelle Sidle, M.D., F.A.C.C. [facility]

## 2022-12-29 NOTE — Patient Instructions (Addendum)
Medication Instructions:  Your physician has recommended you make the following change in your medication:  Decrease carvedilol to 6.25 mg twice daily. You may break your 12.5 mg tablets in 1/2 twice daily until they are finished. Continue all other medications as prescribed  Labwork: none  Testing/Procedures: none  Follow-Up: Your physician recommends that you schedule a follow-up appointment in: 6 months  Any Other Special Instructions Will Be Listed Below (If Applicable).  If you need a refill on your cardiac medications before your next appointment, please call your pharmacy.

## 2023-01-01 DIAGNOSIS — E1122 Type 2 diabetes mellitus with diabetic chronic kidney disease: Secondary | ICD-10-CM | POA: Diagnosis not present

## 2023-01-01 DIAGNOSIS — I10 Essential (primary) hypertension: Secondary | ICD-10-CM | POA: Diagnosis not present

## 2023-01-01 DIAGNOSIS — E782 Mixed hyperlipidemia: Secondary | ICD-10-CM | POA: Diagnosis not present

## 2023-01-01 DIAGNOSIS — E039 Hypothyroidism, unspecified: Secondary | ICD-10-CM | POA: Diagnosis not present

## 2023-02-05 ENCOUNTER — Telehealth: Payer: Self-pay | Admitting: Cardiology

## 2023-02-05 NOTE — Telephone Encounter (Signed)
Pt c/o BP issue: STAT if pt c/o blurred vision, one-sided weakness or slurred speech  1. What are your last 5 BP readings?  12/04: 102/56 12/05: 81/51, 95/46  2. Are you having any other symptoms (ex. Dizziness, headache, blurred vision, passed out)?  No, daughter states patient feels fine  3. What is your BP issue?   BP has been low.

## 2023-02-05 NOTE — Telephone Encounter (Signed)
MD response discussed with daughter, they will monitor for now.

## 2023-02-05 NOTE — Telephone Encounter (Signed)
Daughter was scared when she noted her mothers diastolic bp readings. Patient has no c/o dizziness,weakness etc, says she feels fine.They have not recorded the HR.Coreg was reduced to 6.25 mg bid in October.    I will forward to Dr.McDowell for review.

## 2023-02-09 DIAGNOSIS — E7849 Other hyperlipidemia: Secondary | ICD-10-CM | POA: Diagnosis not present

## 2023-02-09 DIAGNOSIS — N1832 Chronic kidney disease, stage 3b: Secondary | ICD-10-CM | POA: Diagnosis not present

## 2023-02-09 DIAGNOSIS — E039 Hypothyroidism, unspecified: Secondary | ICD-10-CM | POA: Diagnosis not present

## 2023-02-09 DIAGNOSIS — E1122 Type 2 diabetes mellitus with diabetic chronic kidney disease: Secondary | ICD-10-CM | POA: Diagnosis not present

## 2023-02-10 DIAGNOSIS — E1142 Type 2 diabetes mellitus with diabetic polyneuropathy: Secondary | ICD-10-CM | POA: Diagnosis not present

## 2023-02-10 DIAGNOSIS — L84 Corns and callosities: Secondary | ICD-10-CM | POA: Diagnosis not present

## 2023-02-10 DIAGNOSIS — M79676 Pain in unspecified toe(s): Secondary | ICD-10-CM | POA: Diagnosis not present

## 2023-02-10 DIAGNOSIS — B351 Tinea unguium: Secondary | ICD-10-CM | POA: Diagnosis not present

## 2023-02-16 DIAGNOSIS — K439 Ventral hernia without obstruction or gangrene: Secondary | ICD-10-CM | POA: Diagnosis not present

## 2023-02-16 DIAGNOSIS — F331 Major depressive disorder, recurrent, moderate: Secondary | ICD-10-CM | POA: Diagnosis not present

## 2023-02-16 DIAGNOSIS — E7849 Other hyperlipidemia: Secondary | ICD-10-CM | POA: Diagnosis not present

## 2023-02-16 DIAGNOSIS — E1122 Type 2 diabetes mellitus with diabetic chronic kidney disease: Secondary | ICD-10-CM | POA: Diagnosis not present

## 2023-02-16 DIAGNOSIS — I482 Chronic atrial fibrillation, unspecified: Secondary | ICD-10-CM | POA: Diagnosis not present

## 2023-02-16 DIAGNOSIS — D692 Other nonthrombocytopenic purpura: Secondary | ICD-10-CM | POA: Diagnosis not present

## 2023-02-16 DIAGNOSIS — G6289 Other specified polyneuropathies: Secondary | ICD-10-CM | POA: Diagnosis not present

## 2023-02-16 DIAGNOSIS — E1142 Type 2 diabetes mellitus with diabetic polyneuropathy: Secondary | ICD-10-CM | POA: Diagnosis not present

## 2023-02-16 DIAGNOSIS — I5022 Chronic systolic (congestive) heart failure: Secondary | ICD-10-CM | POA: Diagnosis not present

## 2023-03-05 DIAGNOSIS — Z681 Body mass index (BMI) 19 or less, adult: Secondary | ICD-10-CM | POA: Diagnosis not present

## 2023-03-05 DIAGNOSIS — R509 Fever, unspecified: Secondary | ICD-10-CM | POA: Diagnosis not present

## 2023-03-05 DIAGNOSIS — Z20828 Contact with and (suspected) exposure to other viral communicable diseases: Secondary | ICD-10-CM | POA: Diagnosis not present

## 2023-03-05 DIAGNOSIS — J210 Acute bronchiolitis due to respiratory syncytial virus: Secondary | ICD-10-CM | POA: Diagnosis not present

## 2023-03-05 DIAGNOSIS — R059 Cough, unspecified: Secondary | ICD-10-CM | POA: Diagnosis not present

## 2023-03-18 ENCOUNTER — Telehealth: Payer: Self-pay | Admitting: Cardiology

## 2023-03-18 MED ORDER — ISOSORBIDE DINITRATE 30 MG PO TABS: 15.0000 mg | ORAL_TABLET | Freq: Every day | ORAL | 3 refills | Status: DC

## 2023-03-18 MED ORDER — CARVEDILOL 3.125 MG PO TABS: 3.1250 mg | ORAL_TABLET | Freq: Two times a day (BID) | ORAL | 2 refills | Status: DC

## 2023-03-18 NOTE — Telephone Encounter (Signed)
 Pt c/o BP issue: STAT if pt c/o blurred vision, one-sided weakness or slurred speech  1. What are your last 5 BP readings? 88/46, 78/45 88/45 today 93/53, 92/50 yesterday and Monday 86/50  2. Are you having any other symptoms (ex. Dizziness, headache, blurred vision, passed out)? no  3. What is your BP issue? Low blood pressure- made an appointment for tomorrow with Nellie Banas- please call to evaluate

## 2023-03-18 NOTE — Telephone Encounter (Signed)
 Spoke with Patient and Daughter(Donna). Patient stated that she was feeling fine. Reported no symptoms of SOB, chest pains or dizziness. HR was 68 and O2 98. Patient has taking morning medications and she stated that she waited about 2 hours before checking and included all readings. Gave ER precautions and she is scheduled to see Nellie Banas on tomorrow-01/16. No other concerns at this time.

## 2023-03-18 NOTE — Telephone Encounter (Signed)
 Per Dr. Londa Rival:  I would have her cut Coreg  to 3.125 mg twice daily and Imdur  to 15 mg daily.  Keep follow-up as scheduled.  If she is at all symptomatic may need to consider midodrine for blood pressure support.   Spoke with Abe Abed (Daughter) advised her of the changes in medications and to keep follow up on tomorrow. She verbalized understanding.

## 2023-03-19 ENCOUNTER — Ambulatory Visit: Payer: Medicare HMO | Attending: Nurse Practitioner | Admitting: Nurse Practitioner

## 2023-03-19 ENCOUNTER — Encounter: Payer: Self-pay | Admitting: Nurse Practitioner

## 2023-03-19 VITALS — BP 112/62 | HR 62 | Ht 64.0 in | Wt 122.0 lb

## 2023-03-19 DIAGNOSIS — I4821 Permanent atrial fibrillation: Secondary | ICD-10-CM | POA: Diagnosis not present

## 2023-03-19 DIAGNOSIS — I959 Hypotension, unspecified: Secondary | ICD-10-CM | POA: Diagnosis not present

## 2023-03-19 DIAGNOSIS — I5020 Unspecified systolic (congestive) heart failure: Secondary | ICD-10-CM

## 2023-03-19 MED ORDER — ISOSORBIDE MONONITRATE ER 30 MG PO TB24: 15.0000 mg | ORAL_TABLET | Freq: Every day | ORAL | 1 refills | Status: DC

## 2023-03-19 NOTE — Progress Notes (Signed)
Cardiology Office Note:  .   Date:  03/19/2023 ID:  Aimee Miles, DOB [birth year], MRN [id number] PCP: Richardean Chimera, MD  [facility] Providers Cardiologist:  Nona Dell, MD    History of Present Illness: .   Aimee Miles is a 89 y.o. female with a PMH of permanent A-fib, chronic systolic CHF/HFrEF, NICM, who presents today for hypotension evaluation.  Last seen by Dr. Diona Browner on December 29, 2022.  Denied any chest pain or palpitations.  She reported an intermittent productive cough recently, stated she was going to see her PCP regarding the symptoms.  Our office was recently contacted regarding low BP reports.  Patient denied any symptoms.  BP readings were reported to be in range of 78-92 systolic.  Dr. Diona Browner recommended reducing Coreg to 3.125 mg twice daily and Imdur to 15 mg daily.  She was given an office appointment for further evaluation.  Today she presents for office follow-up.  She states she is doing well. Presents today with her daughter. Pt denies any acute cardiac complaints or symptoms.  When reviewing medication list, daughter confirms she is only taking carvedilol 6.25 mg daily instead of twice daily.  States they are waiting on the pharmacy to send a new dosage of carvedilol and has questions regarding form of isosorbide.  Medication list today states patient is taking isosorbide dinitrate 15 mg daily, however other printed med list states Imdur 15 mg daily. Denies any chest pain, shortness of breath, palpitations, syncope, presyncope, dizziness, orthopnea, PND, swelling or significant weight changes, acute bleeding, or claudication.  ROS: Negative. See HPI.  SH: Very active and independent at home. Performs majority of her ADL's independently.   Studies Reviewed: .    Echo 09/2015:  Study Conclusions   - Left ventricle: The cavity size was normal. Wall thickness was    normal. Systolic function was moderately reduced. The estimated    ejection  fraction was in the range of 35% to 40%. Diffuse    hypokinesis. The study is not technically sufficient to allow    evaluation of LV diastolic function.  - Ventricular septum: Septal motion showed paradox.  - Aortic valve: Calcified annulus. Trileaflet.  - Mitral valve: There was trivial regurgitation.  - Left atrium: The atrium was moderately dilated.  - Right atrium: Central venous pressure (est): 3 mm Hg.  - Atrial septum: The septum bowed from left to right, consistent    with increased left atrial pressure. No defect or patent foramen    ovale was identified.  - Tricuspid valve: There was moderate regurgitation.  - Pulmonary arteries: PA peak pressure: 35 mm Hg (S).  - Pericardium, extracardiac: There was no pericardial effusion.  Physical Exam:   VS:  BP 112/62   Pulse 62   Ht 5\' 4"  (1.626 m)   Wt 122 lb (55.3 kg)   SpO2 97%   BMI 20.94 kg/m    Wt Readings from Last 3 Encounters:  03/19/23 122 lb (55.3 kg)  12/29/22 122 lb (55.3 kg)  06/13/22 134 lb (60.8 kg)    GEN: Thin, frail 89 y.o. female in no acute distress NECK: No JVD; No carotid bruits CARDIAC: S1/S2, irregularly irregular rhythm, no murmurs, rubs, gallops RESPIRATORY:  Clear and diminished to auscultation without rales, wheezing or rhonchi  ABDOMEN: Soft, non-tender, non-distended EXTREMITIES:  No edema; No deformity   ASSESSMENT AND PLAN: .    HFrEF Stage C, NYHA class I symptoms. NICM. EF 35 to 40% in  2017. Euvolemic and well compensated on exam.  GDMT limited by BP trends - see below. Will communicate with pharmacy for patient to take Coreg 3.125 mg BID and Imdur at 15 mg daily. No other medication changes at this time. Low sodium diet, fluid restriction <2L, and daily weights encouraged. Educated to contact our office for weight gain of 2 lbs overnight or 5 lbs in one week.  Hypotension Patient asymptomatic with her past low SBP readings.  BP is improved after medications have been self adjusted by  patient/daughter.  Communicating with pharmacy to adjust medications-see above.  No other medication changes at this time.  BP log given. Discussed to monitor BP at home at least 2 hours after medications and sitting for 5-10 minutes.  Permanent A-fib Denies any tachycardia or palpitations.  Heart rate is well-controlled.  Communicated with pharmacy to dose medications-see above.  Continue Eliquis 2.5 mg twice daily for stroke prophylaxis.  Will request most recent lab work from Dr. Rosann Auerbach office.      Dispo: Follow-up with Dr. Diona Browner or APP in 4 to 6 months or sooner if any changes.  Signed, Sharlene Dory, NP

## 2023-03-19 NOTE — Patient Instructions (Addendum)
Medication Instructions:  Your physician recommends that you continue on your current medications as directed. Please refer to the Current Medication list given to you today.  Labwork: None   Testing/Procedures: None   Follow-Up: Your physician recommends that you schedule a follow-up appointment in: 4-6 months   Any Other Special Instructions Will Be Listed Below (If Applicable).  If you need a refill on your cardiac medications before your next appointment, please call your pharmacy.

## 2023-04-01 DIAGNOSIS — Z681 Body mass index (BMI) 19 or less, adult: Secondary | ICD-10-CM | POA: Diagnosis not present

## 2023-04-01 DIAGNOSIS — R051 Acute cough: Secondary | ICD-10-CM | POA: Diagnosis not present

## 2023-04-01 DIAGNOSIS — Z20828 Contact with and (suspected) exposure to other viral communicable diseases: Secondary | ICD-10-CM | POA: Diagnosis not present

## 2023-04-01 DIAGNOSIS — Z2089 Contact with and (suspected) exposure to other communicable diseases: Secondary | ICD-10-CM | POA: Diagnosis not present

## 2023-04-01 DIAGNOSIS — B349 Viral infection, unspecified: Secondary | ICD-10-CM | POA: Diagnosis not present

## 2023-04-03 DIAGNOSIS — E782 Mixed hyperlipidemia: Secondary | ICD-10-CM | POA: Diagnosis not present

## 2023-04-03 DIAGNOSIS — I482 Chronic atrial fibrillation, unspecified: Secondary | ICD-10-CM | POA: Diagnosis not present

## 2023-04-03 DIAGNOSIS — I10 Essential (primary) hypertension: Secondary | ICD-10-CM | POA: Diagnosis not present

## 2023-04-03 DIAGNOSIS — E1142 Type 2 diabetes mellitus with diabetic polyneuropathy: Secondary | ICD-10-CM | POA: Diagnosis not present

## 2023-04-03 DIAGNOSIS — E039 Hypothyroidism, unspecified: Secondary | ICD-10-CM | POA: Diagnosis not present

## 2023-04-13 ENCOUNTER — Other Ambulatory Visit: Payer: Self-pay | Admitting: Cardiology

## 2023-04-21 DIAGNOSIS — M79676 Pain in unspecified toe(s): Secondary | ICD-10-CM | POA: Diagnosis not present

## 2023-04-21 DIAGNOSIS — L84 Corns and callosities: Secondary | ICD-10-CM | POA: Diagnosis not present

## 2023-04-21 DIAGNOSIS — B351 Tinea unguium: Secondary | ICD-10-CM | POA: Diagnosis not present

## 2023-04-21 DIAGNOSIS — E1142 Type 2 diabetes mellitus with diabetic polyneuropathy: Secondary | ICD-10-CM | POA: Diagnosis not present

## 2023-05-01 DIAGNOSIS — I482 Chronic atrial fibrillation, unspecified: Secondary | ICD-10-CM | POA: Diagnosis not present

## 2023-05-01 DIAGNOSIS — E039 Hypothyroidism, unspecified: Secondary | ICD-10-CM | POA: Diagnosis not present

## 2023-05-01 DIAGNOSIS — I10 Essential (primary) hypertension: Secondary | ICD-10-CM | POA: Diagnosis not present

## 2023-05-01 DIAGNOSIS — E1142 Type 2 diabetes mellitus with diabetic polyneuropathy: Secondary | ICD-10-CM | POA: Diagnosis not present

## 2023-05-01 DIAGNOSIS — E782 Mixed hyperlipidemia: Secondary | ICD-10-CM | POA: Diagnosis not present

## 2023-06-01 DIAGNOSIS — E039 Hypothyroidism, unspecified: Secondary | ICD-10-CM | POA: Diagnosis not present

## 2023-06-01 DIAGNOSIS — I10 Essential (primary) hypertension: Secondary | ICD-10-CM | POA: Diagnosis not present

## 2023-06-01 DIAGNOSIS — E782 Mixed hyperlipidemia: Secondary | ICD-10-CM | POA: Diagnosis not present

## 2023-06-01 DIAGNOSIS — E1142 Type 2 diabetes mellitus with diabetic polyneuropathy: Secondary | ICD-10-CM | POA: Diagnosis not present

## 2023-06-01 DIAGNOSIS — I482 Chronic atrial fibrillation, unspecified: Secondary | ICD-10-CM | POA: Diagnosis not present

## 2023-06-10 DIAGNOSIS — E1165 Type 2 diabetes mellitus with hyperglycemia: Secondary | ICD-10-CM | POA: Diagnosis not present

## 2023-06-10 DIAGNOSIS — N183 Chronic kidney disease, stage 3 unspecified: Secondary | ICD-10-CM | POA: Diagnosis not present

## 2023-06-10 DIAGNOSIS — E1122 Type 2 diabetes mellitus with diabetic chronic kidney disease: Secondary | ICD-10-CM | POA: Diagnosis not present

## 2023-06-10 DIAGNOSIS — E039 Hypothyroidism, unspecified: Secondary | ICD-10-CM | POA: Diagnosis not present

## 2023-06-10 DIAGNOSIS — E7849 Other hyperlipidemia: Secondary | ICD-10-CM | POA: Diagnosis not present

## 2023-06-17 DIAGNOSIS — I482 Chronic atrial fibrillation, unspecified: Secondary | ICD-10-CM | POA: Diagnosis not present

## 2023-06-17 DIAGNOSIS — I10 Essential (primary) hypertension: Secondary | ICD-10-CM | POA: Diagnosis not present

## 2023-06-17 DIAGNOSIS — E039 Hypothyroidism, unspecified: Secondary | ICD-10-CM | POA: Diagnosis not present

## 2023-06-17 DIAGNOSIS — E782 Mixed hyperlipidemia: Secondary | ICD-10-CM | POA: Diagnosis not present

## 2023-06-17 DIAGNOSIS — Z681 Body mass index (BMI) 19 or less, adult: Secondary | ICD-10-CM | POA: Diagnosis not present

## 2023-06-30 DIAGNOSIS — E1142 Type 2 diabetes mellitus with diabetic polyneuropathy: Secondary | ICD-10-CM | POA: Diagnosis not present

## 2023-06-30 DIAGNOSIS — L84 Corns and callosities: Secondary | ICD-10-CM | POA: Diagnosis not present

## 2023-06-30 DIAGNOSIS — M79674 Pain in right toe(s): Secondary | ICD-10-CM | POA: Diagnosis not present

## 2023-06-30 DIAGNOSIS — B351 Tinea unguium: Secondary | ICD-10-CM | POA: Diagnosis not present

## 2023-06-30 DIAGNOSIS — M79675 Pain in left toe(s): Secondary | ICD-10-CM | POA: Diagnosis not present

## 2023-07-01 DIAGNOSIS — E782 Mixed hyperlipidemia: Secondary | ICD-10-CM | POA: Diagnosis not present

## 2023-07-01 DIAGNOSIS — E1142 Type 2 diabetes mellitus with diabetic polyneuropathy: Secondary | ICD-10-CM | POA: Diagnosis not present

## 2023-07-01 DIAGNOSIS — I10 Essential (primary) hypertension: Secondary | ICD-10-CM | POA: Diagnosis not present

## 2023-07-01 DIAGNOSIS — E039 Hypothyroidism, unspecified: Secondary | ICD-10-CM | POA: Diagnosis not present

## 2023-07-01 DIAGNOSIS — I482 Chronic atrial fibrillation, unspecified: Secondary | ICD-10-CM | POA: Diagnosis not present

## 2023-07-14 DIAGNOSIS — H35363 Drusen (degenerative) of macula, bilateral: Secondary | ICD-10-CM | POA: Diagnosis not present

## 2023-07-31 DIAGNOSIS — E1142 Type 2 diabetes mellitus with diabetic polyneuropathy: Secondary | ICD-10-CM | POA: Diagnosis not present

## 2023-07-31 DIAGNOSIS — E039 Hypothyroidism, unspecified: Secondary | ICD-10-CM | POA: Diagnosis not present

## 2023-07-31 DIAGNOSIS — E782 Mixed hyperlipidemia: Secondary | ICD-10-CM | POA: Diagnosis not present

## 2023-07-31 DIAGNOSIS — I10 Essential (primary) hypertension: Secondary | ICD-10-CM | POA: Diagnosis not present

## 2023-07-31 DIAGNOSIS — I482 Chronic atrial fibrillation, unspecified: Secondary | ICD-10-CM | POA: Diagnosis not present

## 2023-08-24 ENCOUNTER — Ambulatory Visit: Payer: Medicare HMO | Attending: Nurse Practitioner | Admitting: Nurse Practitioner

## 2023-08-24 ENCOUNTER — Encounter: Payer: Self-pay | Admitting: Nurse Practitioner

## 2023-08-24 VITALS — BP 116/70 | HR 65 | Ht 65.0 in | Wt 121.0 lb

## 2023-08-24 DIAGNOSIS — I5020 Unspecified systolic (congestive) heart failure: Secondary | ICD-10-CM | POA: Diagnosis not present

## 2023-08-24 DIAGNOSIS — I4821 Permanent atrial fibrillation: Secondary | ICD-10-CM

## 2023-08-24 DIAGNOSIS — N1832 Chronic kidney disease, stage 3b: Secondary | ICD-10-CM

## 2023-08-24 NOTE — Patient Instructions (Addendum)
 Medication Instructions:  Your physician recommends that you continue on your current medications as directed. Please refer to the Current Medication list given to you today.  Labwork: NONE   Testing/Procedures: NONE   Follow-Up: Your physician recommends that you schedule a follow-up appointment in: 6 MONTHS   Any Other Special Instructions Will Be Listed Below (If Applicable).  If you need a refill on your cardiac medications before your next appointment, please call your pharmacy.

## 2023-08-24 NOTE — Progress Notes (Signed)
 Cardiology Office Note:  .   Date:  08/24/2023 ID:  Aimee Miles, DOB 06-18-1930, MRN [id number] PCP: Toribio Jerel MATSU, MD  [facility] Providers Cardiologist:  Jayson Sierras, MD    History of Present Illness: .   Aimee Miles is a 89 y.o. female with a PMH of permanent A-fib, chronic systolic CHF/HFrEF, NICM, who presents today for hypotension evaluation.  Last seen by Dr. Sierras on December 29, 2022.  Denied any chest pain or palpitations.  She reported an intermittent productive cough recently, stated she was going to see her PCP regarding the symptoms.  Our office was recently contacted regarding low BP reports.  Patient denied any symptoms.  BP readings were reported to be in range of 78-92 systolic.  Dr. Sierras recommended reducing Coreg  to 3.125 mg twice daily and Imdur  to 15 mg daily.  She was given an office appointment for further evaluation.  03/19/2023 - Today she presents for office follow-up.  She states she is doing well. Presents today with her daughter. Pt denies any acute cardiac complaints or symptoms.  When reviewing medication list, daughter confirms she is only taking carvedilol  6.25 mg daily instead of twice daily.  States they are waiting on the pharmacy to send a new dosage of carvedilol  and has questions regarding form of isosorbide .  Medication list today states patient is taking isosorbide  dinitrate 15 mg daily, however other printed med list states Imdur  15 mg daily. Denies any chest pain, shortness of breath, palpitations, syncope, presyncope, dizziness, orthopnea, PND, swelling or significant weight changes, acute bleeding, or claudication.  08/24/2023 - Here for follow-up. Denies any complaints or issues. Denies any chest pain, shortness of breath, palpitations, syncope, presyncope, dizziness, orthopnea, PND, swelling or significant weight changes, acute bleeding, or claudication.  ROS: Negative. See HPI.  SH: Very active and independent at home.  Performs majority of her ADL's independently. 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   Studies Reviewed: SABRA    EKG: EKG Interpretation Date/Time:  Monday August 24 2023 10:26:38 EDT Ventricular Rate:  56 PR Interval:    QRS Duration:  140 QT Interval:  460 QTC Calculation: 443 R Axis:   194  Text Interpretation: Atrial fibrillation with slow ventricular response Right superior axis deviation Non-specific intra-ventricular conduction block Cannot rule out Septal infarct , age undetermined T wave abnormality, consider lateral ischemia No previous ECGs available Confirmed by Miriam Norris [phone]) on 08/24/2023 10:31:23 AM   Echo 09/2015:  Study Conclusions   - Left ventricle: The cavity size was normal. Wall thickness was    normal. Systolic function was moderately reduced. The estimated    ejection fraction was in the range of 35% to 40%. Diffuse    hypokinesis. The study is not technically sufficient to allow    evaluation of LV diastolic function.  - Ventricular septum: Septal motion showed paradox.  - Aortic valve: Calcified annulus. Trileaflet.  - Mitral valve: There was trivial regurgitation.  - Left atrium: The atrium was moderately dilated.  - Right atrium: Central venous pressure (est): 3 mm Hg.  - Atrial septum: The septum bowed from left to right, consistent    with increased left atrial pressure. No defect or patent foramen    ovale was identified.  - Tricuspid valve: There was moderate regurgitation.  - Pulmonary arteries: PA peak pressure: 35 mm Hg (S).  - Pericardium, extracardiac: There was no pericardial effusion.  Physical Exam:   VS:  BP 116/70   Pulse 65   Ht 5' 5 (1.651 m)   Wt 121 lb (54.9 kg)   SpO2 97%   BMI 20.14 kg/m    Wt Readings from Last 3 Encounters:  08/24/23 121 lb (54.9 kg)  03/19/23 122 lb (55.3 kg)  12/29/22 122 lb (55.3 kg)    GEN: Thin, frail 89 y.o. female in no acute distress NECK: No JVD; No carotid  bruits CARDIAC: S1/S2, irregularly irregular rhythm, no murmurs, rubs, gallops RESPIRATORY:  Clear and diminished to auscultation without rales, wheezing or rhonchi  ABDOMEN: Soft, non-tender, non-distended EXTREMITIES:  No edema; No deformity   ASSESSMENT AND PLAN: .    HFrEF Stage C, NYHA class I symptoms. NICM. EF 35 to 40% in 2017. Euvolemic and well compensated on exam.  GDMT limited by BP trends in the past. Denies any recent low BP readings. No medication changes at this time. Low sodium diet, fluid restriction <2L, and daily weights encouraged. Educated to contact our office for weight gain of 2 lbs overnight or 5 lbs in one week.  Permanent A-fib Denies any tachycardia or palpitations.  Heart rate is well-controlled, asymptomatic with current HR.  Continue Eliquis 2.5 mg twice daily for stroke prophylaxis.  Will request most recent lab work from Dr. Shayne office  3. CKD stage 3b Most recent labs from 1 year ago show stable kidney function. Avoid nephrotoxic agents. No medication changes. Continue to f/u with PCP.    Dispo: Follow-up with Dr. Debera or APP in 6 months or sooner if any changes.  Signed, [name], NP

## 2023-08-31 ENCOUNTER — Ambulatory Visit: Payer: Medicare HMO | Admitting: Nurse Practitioner

## 2023-08-31 DIAGNOSIS — E1142 Type 2 diabetes mellitus with diabetic polyneuropathy: Secondary | ICD-10-CM | POA: Diagnosis not present

## 2023-08-31 DIAGNOSIS — I10 Essential (primary) hypertension: Secondary | ICD-10-CM | POA: Diagnosis not present

## 2023-08-31 DIAGNOSIS — I482 Chronic atrial fibrillation, unspecified: Secondary | ICD-10-CM | POA: Diagnosis not present

## 2023-08-31 DIAGNOSIS — E782 Mixed hyperlipidemia: Secondary | ICD-10-CM | POA: Diagnosis not present

## 2023-08-31 DIAGNOSIS — E039 Hypothyroidism, unspecified: Secondary | ICD-10-CM | POA: Diagnosis not present

## 2023-09-05 DIAGNOSIS — Z681 Body mass index (BMI) 19 or less, adult: Secondary | ICD-10-CM | POA: Diagnosis not present

## 2023-09-05 DIAGNOSIS — J019 Acute sinusitis, unspecified: Secondary | ICD-10-CM | POA: Diagnosis not present

## 2023-09-11 ENCOUNTER — Other Ambulatory Visit: Payer: Self-pay | Admitting: Nurse Practitioner

## 2023-09-22 DIAGNOSIS — B351 Tinea unguium: Secondary | ICD-10-CM | POA: Diagnosis not present

## 2023-09-22 DIAGNOSIS — E1142 Type 2 diabetes mellitus with diabetic polyneuropathy: Secondary | ICD-10-CM | POA: Diagnosis not present

## 2023-09-22 DIAGNOSIS — M79674 Pain in right toe(s): Secondary | ICD-10-CM | POA: Diagnosis not present

## 2023-09-22 DIAGNOSIS — L84 Corns and callosities: Secondary | ICD-10-CM | POA: Diagnosis not present

## 2023-09-22 DIAGNOSIS — M79675 Pain in left toe(s): Secondary | ICD-10-CM | POA: Diagnosis not present

## 2023-10-01 DIAGNOSIS — I482 Chronic atrial fibrillation, unspecified: Secondary | ICD-10-CM | POA: Diagnosis not present

## 2023-10-01 DIAGNOSIS — E039 Hypothyroidism, unspecified: Secondary | ICD-10-CM | POA: Diagnosis not present

## 2023-10-01 DIAGNOSIS — E1142 Type 2 diabetes mellitus with diabetic polyneuropathy: Secondary | ICD-10-CM | POA: Diagnosis not present

## 2023-10-01 DIAGNOSIS — I10 Essential (primary) hypertension: Secondary | ICD-10-CM | POA: Diagnosis not present

## 2023-10-01 DIAGNOSIS — E782 Mixed hyperlipidemia: Secondary | ICD-10-CM | POA: Diagnosis not present

## 2023-10-12 ENCOUNTER — Other Ambulatory Visit: Payer: Self-pay

## 2023-10-12 ENCOUNTER — Telehealth: Payer: Self-pay | Admitting: Nurse Practitioner

## 2023-10-12 MED ORDER — ISOSORBIDE MONONITRATE ER 30 MG PO TB24: 15.0000 mg | ORAL_TABLET | Freq: Every day | ORAL | 3 refills | Status: AC

## 2023-10-12 NOTE — Telephone Encounter (Signed)
 Rx sent in

## 2023-10-12 NOTE — Telephone Encounter (Signed)
 1. Which medications need to be refilled? (please list name of each medication and dose if known) Isosorbide  mononitrate  2. Which pharmacy/location (including street and city if local pharmacy) is medication to be sent to?[facility]  3. Do they need a 30 day or 90 day supply? 90  Pt needs sent in As soon as possible

## 2023-10-22 DIAGNOSIS — N183 Chronic kidney disease, stage 3 unspecified: Secondary | ICD-10-CM | POA: Diagnosis not present

## 2023-10-22 DIAGNOSIS — E039 Hypothyroidism, unspecified: Secondary | ICD-10-CM | POA: Diagnosis not present

## 2023-10-22 DIAGNOSIS — R300 Dysuria: Secondary | ICD-10-CM | POA: Diagnosis not present

## 2023-10-22 DIAGNOSIS — E7849 Other hyperlipidemia: Secondary | ICD-10-CM | POA: Diagnosis not present

## 2023-10-22 DIAGNOSIS — I10 Essential (primary) hypertension: Secondary | ICD-10-CM | POA: Diagnosis not present

## 2023-10-22 DIAGNOSIS — E1122 Type 2 diabetes mellitus with diabetic chronic kidney disease: Secondary | ICD-10-CM | POA: Diagnosis not present

## 2023-10-22 DIAGNOSIS — E1165 Type 2 diabetes mellitus with hyperglycemia: Secondary | ICD-10-CM | POA: Diagnosis not present

## 2023-10-29 DIAGNOSIS — E782 Mixed hyperlipidemia: Secondary | ICD-10-CM | POA: Diagnosis not present

## 2023-10-29 DIAGNOSIS — E1122 Type 2 diabetes mellitus with diabetic chronic kidney disease: Secondary | ICD-10-CM | POA: Diagnosis not present

## 2023-10-29 DIAGNOSIS — Z1389 Encounter for screening for other disorder: Secondary | ICD-10-CM | POA: Diagnosis not present

## 2023-10-29 DIAGNOSIS — E7849 Other hyperlipidemia: Secondary | ICD-10-CM | POA: Diagnosis not present

## 2023-10-29 DIAGNOSIS — Z681 Body mass index (BMI) 19 or less, adult: Secondary | ICD-10-CM | POA: Diagnosis not present

## 2023-10-29 DIAGNOSIS — Z0001 Encounter for general adult medical examination with abnormal findings: Secondary | ICD-10-CM | POA: Diagnosis not present

## 2023-10-29 DIAGNOSIS — Z1331 Encounter for screening for depression: Secondary | ICD-10-CM | POA: Diagnosis not present

## 2023-10-29 DIAGNOSIS — I10 Essential (primary) hypertension: Secondary | ICD-10-CM | POA: Diagnosis not present

## 2023-10-29 DIAGNOSIS — I482 Chronic atrial fibrillation, unspecified: Secondary | ICD-10-CM | POA: Diagnosis not present

## 2023-10-30 DIAGNOSIS — I10 Essential (primary) hypertension: Secondary | ICD-10-CM | POA: Diagnosis not present

## 2023-10-30 DIAGNOSIS — E039 Hypothyroidism, unspecified: Secondary | ICD-10-CM | POA: Diagnosis not present

## 2023-10-30 DIAGNOSIS — E782 Mixed hyperlipidemia: Secondary | ICD-10-CM | POA: Diagnosis not present

## 2023-10-30 DIAGNOSIS — E1142 Type 2 diabetes mellitus with diabetic polyneuropathy: Secondary | ICD-10-CM | POA: Diagnosis not present

## 2023-10-30 DIAGNOSIS — I482 Chronic atrial fibrillation, unspecified: Secondary | ICD-10-CM | POA: Diagnosis not present

## 2023-11-23 DIAGNOSIS — Z23 Encounter for immunization: Secondary | ICD-10-CM | POA: Diagnosis not present

## 2023-12-01 DIAGNOSIS — E782 Mixed hyperlipidemia: Secondary | ICD-10-CM | POA: Diagnosis not present

## 2023-12-01 DIAGNOSIS — E1142 Type 2 diabetes mellitus with diabetic polyneuropathy: Secondary | ICD-10-CM | POA: Diagnosis not present

## 2023-12-01 DIAGNOSIS — I482 Chronic atrial fibrillation, unspecified: Secondary | ICD-10-CM | POA: Diagnosis not present

## 2023-12-01 DIAGNOSIS — I10 Essential (primary) hypertension: Secondary | ICD-10-CM | POA: Diagnosis not present

## 2023-12-01 DIAGNOSIS — E039 Hypothyroidism, unspecified: Secondary | ICD-10-CM | POA: Diagnosis not present

## 2023-12-02 ENCOUNTER — Other Ambulatory Visit: Payer: Self-pay | Admitting: Cardiology

## 2023-12-15 DIAGNOSIS — L84 Corns and callosities: Secondary | ICD-10-CM | POA: Diagnosis not present

## 2023-12-15 DIAGNOSIS — M79675 Pain in left toe(s): Secondary | ICD-10-CM | POA: Diagnosis not present

## 2023-12-15 DIAGNOSIS — E1142 Type 2 diabetes mellitus with diabetic polyneuropathy: Secondary | ICD-10-CM | POA: Diagnosis not present

## 2023-12-15 DIAGNOSIS — B351 Tinea unguium: Secondary | ICD-10-CM | POA: Diagnosis not present

## 2023-12-15 DIAGNOSIS — M79674 Pain in right toe(s): Secondary | ICD-10-CM | POA: Diagnosis not present

## 2024-01-01 DIAGNOSIS — I10 Essential (primary) hypertension: Secondary | ICD-10-CM | POA: Diagnosis not present

## 2024-01-01 DIAGNOSIS — E1142 Type 2 diabetes mellitus with diabetic polyneuropathy: Secondary | ICD-10-CM | POA: Diagnosis not present

## 2024-01-01 DIAGNOSIS — E039 Hypothyroidism, unspecified: Secondary | ICD-10-CM | POA: Diagnosis not present

## 2024-01-01 DIAGNOSIS — I482 Chronic atrial fibrillation, unspecified: Secondary | ICD-10-CM | POA: Diagnosis not present

## 2024-01-01 DIAGNOSIS — E782 Mixed hyperlipidemia: Secondary | ICD-10-CM | POA: Diagnosis not present

## 2024-01-25 DIAGNOSIS — H524 Presbyopia: Secondary | ICD-10-CM | POA: Diagnosis not present

## 2024-01-26 DIAGNOSIS — Z7901 Long term (current) use of anticoagulants: Secondary | ICD-10-CM | POA: Diagnosis not present

## 2024-01-26 DIAGNOSIS — Z79890 Hormone replacement therapy (postmenopausal): Secondary | ICD-10-CM | POA: Diagnosis not present

## 2024-01-26 DIAGNOSIS — Z833 Family history of diabetes mellitus: Secondary | ICD-10-CM | POA: Diagnosis not present

## 2024-01-26 DIAGNOSIS — I4891 Unspecified atrial fibrillation: Secondary | ICD-10-CM | POA: Diagnosis not present

## 2024-01-26 DIAGNOSIS — I429 Cardiomyopathy, unspecified: Secondary | ICD-10-CM | POA: Diagnosis not present

## 2024-01-26 DIAGNOSIS — E039 Hypothyroidism, unspecified: Secondary | ICD-10-CM | POA: Diagnosis not present

## 2024-01-26 DIAGNOSIS — I25119 Atherosclerotic heart disease of native coronary artery with unspecified angina pectoris: Secondary | ICD-10-CM | POA: Diagnosis not present

## 2024-01-26 DIAGNOSIS — D6869 Other thrombophilia: Secondary | ICD-10-CM | POA: Diagnosis not present

## 2024-01-26 DIAGNOSIS — D529 Folate deficiency anemia, unspecified: Secondary | ICD-10-CM | POA: Diagnosis not present

## 2024-01-26 DIAGNOSIS — E876 Hypokalemia: Secondary | ICD-10-CM | POA: Diagnosis not present

## 2024-01-26 DIAGNOSIS — I509 Heart failure, unspecified: Secondary | ICD-10-CM | POA: Diagnosis not present

## 2024-01-26 DIAGNOSIS — E785 Hyperlipidemia, unspecified: Secondary | ICD-10-CM | POA: Diagnosis not present

## 2024-01-26 DIAGNOSIS — D6949 Other primary thrombocytopenia: Secondary | ICD-10-CM | POA: Diagnosis not present

## 2024-01-26 DIAGNOSIS — Z7984 Long term (current) use of oral hypoglycemic drugs: Secondary | ICD-10-CM | POA: Diagnosis not present

## 2024-01-26 DIAGNOSIS — F325 Major depressive disorder, single episode, in full remission: Secondary | ICD-10-CM | POA: Diagnosis not present

## 2024-01-26 DIAGNOSIS — Z9989 Dependence on other enabling machines and devices: Secondary | ICD-10-CM | POA: Diagnosis not present

## 2024-01-26 DIAGNOSIS — I130 Hypertensive heart and chronic kidney disease with heart failure and stage 1 through stage 4 chronic kidney disease, or unspecified chronic kidney disease: Secondary | ICD-10-CM | POA: Diagnosis not present

## 2024-01-26 DIAGNOSIS — E1142 Type 2 diabetes mellitus with diabetic polyneuropathy: Secondary | ICD-10-CM | POA: Diagnosis not present

## 2024-01-29 DIAGNOSIS — E1142 Type 2 diabetes mellitus with diabetic polyneuropathy: Secondary | ICD-10-CM | POA: Diagnosis not present

## 2024-01-29 DIAGNOSIS — I10 Essential (primary) hypertension: Secondary | ICD-10-CM | POA: Diagnosis not present

## 2024-01-29 DIAGNOSIS — I482 Chronic atrial fibrillation, unspecified: Secondary | ICD-10-CM | POA: Diagnosis not present

## 2024-01-29 DIAGNOSIS — E782 Mixed hyperlipidemia: Secondary | ICD-10-CM | POA: Diagnosis not present

## 2024-01-29 DIAGNOSIS — E039 Hypothyroidism, unspecified: Secondary | ICD-10-CM | POA: Diagnosis not present

## 2024-02-15 ENCOUNTER — Encounter: Payer: Self-pay | Admitting: Nurse Practitioner

## 2024-02-15 ENCOUNTER — Ambulatory Visit: Attending: Nurse Practitioner | Admitting: Nurse Practitioner

## 2024-02-15 VITALS — BP 124/60 | HR 61 | Ht 63.0 in | Wt 118.0 lb

## 2024-02-15 DIAGNOSIS — N1832 Chronic kidney disease, stage 3b: Secondary | ICD-10-CM

## 2024-02-15 DIAGNOSIS — R600 Localized edema: Secondary | ICD-10-CM

## 2024-02-15 DIAGNOSIS — I5020 Unspecified systolic (congestive) heart failure: Secondary | ICD-10-CM

## 2024-02-15 DIAGNOSIS — I4821 Permanent atrial fibrillation: Secondary | ICD-10-CM

## 2024-02-15 MED ORDER — FUROSEMIDE 40 MG PO TABS: 40.0000 mg | ORAL_TABLET | Freq: Every day | ORAL | 6 refills | Status: AC

## 2024-02-15 NOTE — Progress Notes (Unsigned)
 Cardiology Office Note:  .   Date:  08/24/2023 ID:  Aimee Miles, DOB 27-Jul-1930, MRN [id number] PCP: Toribio Jerel MATSU, MD  [facility] Providers Cardiologist:  Jayson Sierras, MD    History of Present Illness: .   Aimee Miles is a 89 y.o. female with a PMH of permanent A-fib, chronic systolic CHF/HFrEF, NICM, who presents today for hypotension evaluation.  Last seen by Dr. Sierras on December 29, 2022.  Denied any chest pain or palpitations.  She reported an intermittent productive cough recently, stated she was going to see her PCP regarding the symptoms.  Our office was recently contacted regarding low BP reports.  Patient denied any symptoms.  BP readings were reported to be in range of 78-92 systolic.  Dr. Sierras recommended reducing Coreg  to 3.125 mg twice daily and Imdur  to 15 mg daily.  She was given an office appointment for further evaluation.  03/19/2023 - Today she presents for office follow-up.  She states she is doing well. Presents today with her daughter. Pt denies any acute cardiac complaints or symptoms.  When reviewing medication list, daughter confirms she is only taking carvedilol  6.25 mg daily instead of twice daily.  States they are waiting on the pharmacy to send a new dosage of carvedilol  and has questions regarding form of isosorbide .  Medication list today states patient is taking isosorbide  dinitrate 15 mg daily, however other printed med list states Imdur  15 mg daily. Denies any chest pain, shortness of breath, palpitations, syncope, presyncope, dizziness, orthopnea, PND, swelling or significant weight changes, acute bleeding, or claudication.  08/24/2023 - Here for follow-up. Denies any complaints or issues. Denies any chest pain, shortness of breath, palpitations, syncope, presyncope, dizziness, orthopnea, PND, swelling or significant weight changes, acute bleeding, or claudication.  ROS: Negative. See HPI.  SH: Very active and independent at home.  Performs majority of her ADL's independently. 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   Studies Reviewed: SABRA    EKG:     Echo 09/2015:  Study Conclusions   - Left ventricle: The cavity size was normal. Wall thickness was    normal. Systolic function was moderately reduced. The estimated    ejection fraction was in the range of 35% to 40%. Diffuse    hypokinesis. The study is not technically sufficient to allow    evaluation of LV diastolic function.  - Ventricular septum: Septal motion showed paradox.  - Aortic valve: Calcified annulus. Trileaflet.  - Mitral valve: There was trivial regurgitation.  - Left atrium: The atrium was moderately dilated.  - Right atrium: Central venous pressure (est): 3 mm Hg.  - Atrial septum: The septum bowed from left to right, consistent    with increased left atrial pressure. No defect or patent foramen    ovale was identified.  - Tricuspid valve: There was moderate regurgitation.  - Pulmonary arteries: PA peak pressure: 35 mm Hg (S).  - Pericardium, extracardiac: There was no pericardial effusion.  Physical Exam:   VS:  BP 124/60   Pulse 61   Ht 5' 3 (1.6 m)   Wt 118 lb (53.5 kg) Comment: Cannot weigh-falls-weight from 51m ago  SpO2 95%   BMI 20.90 kg/m    Wt Readings from Last 3 Encounters:  02/15/24 118 lb (53.5 kg)  08/24/23 121 lb (54.9 kg)  03/19/23 122 lb (55.3 kg)    GEN: Thin, frail 89 y.o. female in no acute distress NECK: No JVD;  No carotid bruits CARDIAC: S1/S2, irregularly irregular rhythm, no murmurs, rubs, gallops RESPIRATORY:  Clear and diminished to auscultation without rales, wheezing or rhonchi  ABDOMEN: Soft, non-tender, non-distended EXTREMITIES:  No edema; No deformity   ASSESSMENT AND PLAN: .    HFrEF Stage C, NYHA class I symptoms. NICM. EF 35 to 40% in 2017. Euvolemic and well compensated on exam.  GDMT limited by BP trends in the past. Denies any recent low BP readings. No medication  changes at this time. Low sodium diet, fluid restriction <2L, and daily weights encouraged. Educated to contact our office for weight gain of 2 lbs overnight or 5 lbs in one week.  Permanent A-fib Denies any tachycardia or palpitations.  Heart rate is well-controlled, asymptomatic with current HR.  Continue Eliquis 2.5 mg twice daily for stroke prophylaxis.  Will request most recent lab work from Dr. Shayne office  3. CKD stage 3b Most recent labs from 1 year ago show stable kidney function. Avoid nephrotoxic agents. No medication changes. Continue to f/u with PCP.    Dispo: Follow-up with Dr. Debera or APP in 6 months or sooner if any changes.  Signed, [name], NP

## 2024-02-15 NOTE — Progress Notes (Unsigned)
 Cardiology Office Note:  .   Date:  08/24/2023 ID:  Aimee Miles, DOB 22-Jun-1930, MRN [id number] PCP: Aimee Jerel MATSU, MD  [facility] Providers Cardiologist:  Aimee Sierras, MD    History of Present Illness: .   Aimee Miles is a 89 y.o. female with a PMH of permanent A-fib, chronic systolic CHF/HFrEF, NICM, who presents today for hypotension evaluation.  Last seen by Dr. Sierras on December 29, 2022.  Denied any chest pain or palpitations.  She reported an intermittent productive cough recently, stated she was going to see her PCP regarding the symptoms.  Our office was recently contacted regarding low BP reports.  Patient denied any symptoms.  BP readings were reported to be in range of 78-92 systolic.  Dr. Sierras recommended reducing Coreg  to 3.125 mg twice daily and Imdur  to 15 mg daily.  She was given an office appointment for further evaluation.  03/19/2023 - Today she presents for office follow-up.  She states she is doing well. Presents today with her daughter. Pt denies any acute cardiac complaints or symptoms.  When reviewing medication list, daughter confirms she is only taking carvedilol  6.25 mg daily instead of twice daily.  States they are waiting on the pharmacy to send a new dosage of carvedilol  and has questions regarding form of isosorbide .  Medication list today states patient is taking isosorbide  dinitrate 15 mg daily, however other printed med list states Imdur  15 mg daily. Denies any chest pain, shortness of breath, palpitations, syncope, presyncope, dizziness, orthopnea, PND, swelling or significant weight changes, acute bleeding, or claudication.  08/24/2023 - Here for follow-up. Denies any complaints or issues. Denies any chest pain, shortness of breath, palpitations, syncope, presyncope, dizziness, orthopnea, PND, swelling or significant weight changes, acute bleeding, or claudication.  ROS: Negative. See HPI.  SH: Very active and independent at home.  Performs majority of her ADL's independently. 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   Studies Reviewed: SABRA    EKG:     Echo 09/2015:  Study Conclusions   - Left ventricle: The cavity size was normal. Wall thickness was    normal. Systolic function was moderately reduced. The estimated    ejection fraction was in the range of 35% to 40%. Diffuse    hypokinesis. The study is not technically sufficient to allow    evaluation of LV diastolic function.  - Ventricular septum: Septal motion showed paradox.  - Aortic valve: Calcified annulus. Trileaflet.  - Mitral valve: There was trivial regurgitation.  - Left atrium: The atrium was moderately dilated.  - Right atrium: Central venous pressure (est): 3 mm Hg.  - Atrial septum: The septum bowed from left to right, consistent    with increased left atrial pressure. No defect or patent foramen    ovale was identified.  - Tricuspid valve: There was moderate regurgitation.  - Pulmonary arteries: PA peak pressure: 35 mm Hg (S).  - Pericardium, extracardiac: There was no pericardial effusion.  Physical Exam:   VS:  BP 124/60   Pulse 61   Ht 5' 3 (1.6 m)   Wt 118 lb (53.5 kg) Comment: Cannot weigh-falls-weight from 15m ago  SpO2 95%   BMI 20.90 kg/m    Wt Readings from Last 3 Encounters:  02/15/24 118 lb (53.5 kg)  08/24/23 121 lb (54.9 kg)  03/19/23 122 lb (55.3 kg)    GEN: Thin, frail 89 y.o. female in no acute distress NECK: No JVD;  No carotid bruits CARDIAC: S1/S2, irregularly irregular rhythm, no murmurs, rubs, gallops RESPIRATORY:  Clear and diminished to auscultation without rales, wheezing or rhonchi  ABDOMEN: Soft, non-tender, non-distended EXTREMITIES:  No edema; No deformity   ASSESSMENT AND PLAN: .    HFrEF Stage C, NYHA class I symptoms. NICM. EF 35 to 40% in 2017. Euvolemic and well compensated on exam.  GDMT limited by BP trends in the past. Denies any recent low BP readings. No medication  changes at this time. Low sodium diet, fluid restriction <2L, and daily weights encouraged. Educated to contact our office for weight gain of 2 lbs overnight or 5 lbs in one week.  Permanent A-fib Denies any tachycardia or palpitations.  Heart rate is well-controlled, asymptomatic with current HR.  Continue Eliquis 2.5 mg twice daily for stroke prophylaxis.  Will request most recent lab work from Dr. Shayne office  3. CKD stage 3b Most recent labs from 1 year ago show stable kidney function. Avoid nephrotoxic agents. No medication changes. Continue to f/u with PCP.    Dispo: Follow-up with Dr. Debera or APP in 6 months or sooner if any changes.  Signed, [name], NP

## 2024-02-15 NOTE — Patient Instructions (Addendum)
 Medication Instructions:   May take an extra Lasix  as needed for increased swelling Continue all other medications.     Labwork:  none  Testing/Procedures:  none  Follow-Up:  6 months   Any Other Special Instructions Will Be Listed Below (If Applicable).   If you need a refill on your cardiac medications before your next appointment, please call your pharmacy.
